# Patient Record
Sex: Female | Born: 1945 | Race: White | Hispanic: No | Marital: Married | Smoking: Never smoker
Health system: Southern US, Community
[De-identification: ages and names within clinical notes are randomized; demographics above are authoritative.]

## PROBLEM LIST (undated history)

## (undated) DIAGNOSIS — I059 Rheumatic mitral valve disease, unspecified: Secondary | ICD-10-CM

## (undated) DIAGNOSIS — I491 Atrial premature depolarization: Secondary | ICD-10-CM

## (undated) DIAGNOSIS — J301 Allergic rhinitis due to pollen: Secondary | ICD-10-CM

## (undated) DIAGNOSIS — R911 Solitary pulmonary nodule: Secondary | ICD-10-CM

## (undated) DIAGNOSIS — J45909 Unspecified asthma, uncomplicated: Secondary | ICD-10-CM

## (undated) DIAGNOSIS — I10 Essential (primary) hypertension: Secondary | ICD-10-CM

## (undated) DIAGNOSIS — I341 Nonrheumatic mitral (valve) prolapse: Secondary | ICD-10-CM

## (undated) DIAGNOSIS — K5900 Constipation, unspecified: Secondary | ICD-10-CM

## (undated) DIAGNOSIS — K219 Gastro-esophageal reflux disease without esophagitis: Secondary | ICD-10-CM

## (undated) DIAGNOSIS — R002 Palpitations: Secondary | ICD-10-CM

## (undated) HISTORY — PX: LAPAROSCOPIC HYSTERECTOMY: SHX1926

## (undated) HISTORY — DX: Atrial premature depolarization: I49.1

## (undated) HISTORY — DX: Palpitations: R00.2

## (undated) HISTORY — DX: Rheumatic mitral valve disease, unspecified: I05.9

## (undated) HISTORY — DX: Gastro-esophageal reflux disease without esophagitis: K21.9

## (undated) HISTORY — DX: Solitary pulmonary nodule: R91.1

## (undated) HISTORY — DX: Nonrheumatic mitral (valve) prolapse: I34.1

## (undated) HISTORY — PX: BACK SURGERY: SHX140

## (undated) HISTORY — DX: Allergic rhinitis due to pollen: J30.1

## (undated) HISTORY — DX: Constipation, unspecified: K59.00

## (undated) HISTORY — DX: Essential (primary) hypertension: I10

---

## 1999-04-14 ENCOUNTER — Other Ambulatory Visit: Admission: RE | Admit: 1999-04-14 | Discharge: 1999-04-14 | Payer: Self-pay | Admitting: Gynecology

## 2002-04-23 ENCOUNTER — Encounter (INDEPENDENT_AMBULATORY_CARE_PROVIDER_SITE_OTHER): Payer: Self-pay | Admitting: Specialist

## 2002-04-23 ENCOUNTER — Encounter: Payer: Self-pay | Admitting: Specialist

## 2002-04-23 ENCOUNTER — Observation Stay (HOSPITAL_COMMUNITY): Admission: RE | Admit: 2002-04-23 | Discharge: 2002-04-24 | Payer: Self-pay | Admitting: Specialist

## 2006-12-28 ENCOUNTER — Ambulatory Visit: Payer: Self-pay | Admitting: Cardiovascular Disease

## 2007-01-21 ENCOUNTER — Ambulatory Visit: Payer: Self-pay

## 2008-08-10 ENCOUNTER — Telehealth (INDEPENDENT_AMBULATORY_CARE_PROVIDER_SITE_OTHER): Payer: Self-pay | Admitting: *Deleted

## 2008-09-21 DIAGNOSIS — I10 Essential (primary) hypertension: Secondary | ICD-10-CM | POA: Insufficient documentation

## 2008-09-21 DIAGNOSIS — I491 Atrial premature depolarization: Secondary | ICD-10-CM | POA: Insufficient documentation

## 2008-09-21 DIAGNOSIS — R002 Palpitations: Secondary | ICD-10-CM | POA: Insufficient documentation

## 2008-09-21 DIAGNOSIS — I059 Rheumatic mitral valve disease, unspecified: Secondary | ICD-10-CM | POA: Insufficient documentation

## 2008-09-21 HISTORY — DX: Essential (primary) hypertension: I10

## 2008-09-22 ENCOUNTER — Ambulatory Visit: Payer: Self-pay | Admitting: Cardiovascular Disease

## 2008-10-01 ENCOUNTER — Ambulatory Visit: Payer: Self-pay

## 2008-10-01 ENCOUNTER — Encounter: Payer: Self-pay | Admitting: Cardiovascular Disease

## 2008-10-02 ENCOUNTER — Telehealth: Payer: Self-pay | Admitting: Cardiovascular Disease

## 2008-10-26 ENCOUNTER — Ambulatory Visit: Payer: Self-pay | Admitting: Internal Medicine

## 2008-10-26 DIAGNOSIS — K5900 Constipation, unspecified: Secondary | ICD-10-CM | POA: Insufficient documentation

## 2008-10-26 DIAGNOSIS — K219 Gastro-esophageal reflux disease without esophagitis: Secondary | ICD-10-CM | POA: Insufficient documentation

## 2008-11-19 ENCOUNTER — Ambulatory Visit: Payer: Self-pay | Admitting: Internal Medicine

## 2009-07-12 ENCOUNTER — Encounter (INDEPENDENT_AMBULATORY_CARE_PROVIDER_SITE_OTHER): Payer: Self-pay | Admitting: *Deleted

## 2010-02-22 NOTE — Letter (Signed)
Summary: Appointment - Reminder 2  [employer], Main Office  [address]   [place], [place] [address]   Phone: [phone]  Fax: [phone]     July 12, 2009 MRN: [id number]   [facility] [address] [place], [place]  [address]   Dear Ms. Ivancic,  Our records indicate that it is time to schedule a follow-up appointment with Dr. Eden Emms in August. It is very important that we reach you to schedule this appointment. We look forward to participating in your health care needs. Please contact us at the number listed above at your earliest convenience to schedule your appointment.  If you are unable to make an appointment at this time, give [place] a call so we can update our records.     Sincerely,   Migdalia Dk [facility] Scheduling Team

## 2010-03-21 ENCOUNTER — Ambulatory Visit (INDEPENDENT_AMBULATORY_CARE_PROVIDER_SITE_OTHER): Payer: Medicare Other | Admitting: Cardiovascular Disease

## 2010-03-21 ENCOUNTER — Encounter: Payer: Self-pay | Admitting: Cardiovascular Disease

## 2010-03-21 DIAGNOSIS — I10 Essential (primary) hypertension: Secondary | ICD-10-CM

## 2010-03-21 DIAGNOSIS — I059 Rheumatic mitral valve disease, unspecified: Secondary | ICD-10-CM

## 2010-03-21 DIAGNOSIS — I4949 Other premature depolarization: Secondary | ICD-10-CM

## 2010-03-31 NOTE — Assessment & Plan Note (Signed)
Summary: 14yr f/u sl appt confirm=mj   Visit Type:  Follow-up Referring Provider:  Charlton Haws, MD Primary Provider:  Brent Bulla, MD   CC:  pt has a cold no cardiac complaints.  History of Present Illness: [name] [name] is seen today for PAC's, and mitral disease.  Echo in 2008 did not show prolapse but she had mild to moderate MR.  She has not had afib and gets rare palpations.  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  She needs a F/u echo.  She has gained some weight but does not have exertional dyspnea, SSCP, edema, PND or otrhopnea.  Last echo 10/01/08 with no prolapse and only mild MR  Has white coat hypertension.  BP readings at home are fine and BP came down quite a bit after talking to her during our visit.  Current Problems (verified): 1)  Special Screening For Malignant Neoplasms Colon  (ICD-V76.51) 2)  Gerd  (ICD-530.81) 3)  Constipation  (ICD-564.00) 4)  Premature Atrial Contractions  (ICD-427.61) 5)  Hypertension  (ICD-401.9) 6)  Palpitations  (ICD-785.1) 7)  Mitral Valve Prolapse  (ICD-424.0)  Current Medications (verified): 1)  Calcium-Magnesium-Vitamin D 200-100-33.3 Mg-Mg-Unit Caps (Calcium-Magnesium-Vitamin D) .Marland Kitchen.. 1 Tab By Mouth Two Times A Day 2)  Multivitamins   Tabs (Multiple Vitamin) .... 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   She has two children.   The patient walks on  a regular basis but is otherwise fairly inactive.  Daily Caffeine Use: one daily  Illicit Drug Use - no Alcohol Use - no  Review of Systems       Denies fever, malais, weight loss, blurry vision, decreased visual acuity, cough, sputum, SOB, hemoptysis, pleuritic pain, palpitaitons, heartburn, abdominal pain, melena, lower extremity edema, claudication, or rash.   Vital Signs:  Patient profile:   65 year old female Height:      67 inches Weight:      142 pounds BMI:     22.32 Pulse rate:   85 / minute BP sitting:   130 / 80  (left arm) Cuff size:   180regular  Vitals Entered By: Marrion Coy, CNA (March 21, 2010 11:25 AM)  Physical Exam  General:  Affect appropriate Healthy:  appears stated age HEENT: normal Neck supple with no adenopathy JVP normal no bruits no thyromegaly Lungs clear with no wheezing and good diaphragmatic motion Heart:  S1/S2 no murmur,rub, gallop or click PMI normal Abdomen: benighn, BS positve, no tenderness, no AAA no bruit.  No HSM or HJR Distal pulses intact with no bruits No edema Neuro non-focal Skin warm and dry    Impression & Recommendations:  Problem # 1:  PREMATURE ATRIAL CONTRACTIONS (ICD-427.61) Benign no palpitatons  Problem # 2:  HYPERTENSION (ICD-401.9) White coat.  No need for medication at this point.  Continue home readings.  Low sodium diet  Problem # 3:  MITRAL VALVE PROLAPSE (ICD-424.0) Mild MR  No murmur on exam today.  f/U echo in a year  Patient Instructions: 1)  Your physician recommends that you schedule a follow-up appointment in: 12 months 2)  Your physician recommends that you continue on your current medications as directed. Please refer to the Current Medication list given to you today.   EKG Report  Procedure date:  03/21/2010  Findings:      NSR 85 PAC's

## 2010-06-07 NOTE — Assessment & Plan Note (Signed)
[facility]                            CARDIOLOGY OFFICE NOTE   NAME:[name], [name] [name]                       MRN:          [id number]  DATE:12/28/2006                            DOB:          07/08/1945    [name] [name] is a 65 year old patient referred by Dr. Shary Decamp for checkup of  mitral valve prolapse.   In talking to the patient, she has had long-standing history of MVP. It  is not clear to me that it has every been symptomatic. She does  occasionally get palpitations. They have not changed over the years.  There has been no syncope, no chest pain, no PND or orthopnea.  She has  never had an echocardiogram here at [facility]. She tells me her last  echocardiogram in [place] was three years ago.   We had a lengthy discussion with [name] regarding the diagnosis of MVP.  We showed her a heart model and explained to her that sometimes it is  associated with other things such as palpitations or dysautonomia.   However, the patient has been quite healthy. She has not had any  significant chest pain. She is active. She has had no lower extremity  edema and really no palpitations.   I told her I would like to repeat an echocardiogram up here at [facility]  so we can see if she even has a diagnosis. She clearly does not have  malignant Barlow's syndrome. Her review of systems otherwise is  negative.   Her past medical history is fairly benign. She has had a hysterectomy  and back surgery.   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  She has two children. Her mother was with her today. She was a long-term  patient of Dr. Corinda Gubler. The patient walks on a regular basis but  otherwise fairly inactive.   She denies any allergies.   Her current medications only include Caltrate and calcium.   Family History: non-contributory   Her exam is remarkable for a weight of 133, afebrile, respiratory rate  12, blood pressure 150/80, pulse 82  and occasional PACs with sinus  arrhythmia. Affect appropriate.  HEENT:  Unremarkable.  Carotids are normal without bruit. No lymphadenopathy, thyromegaly, or  JVP elevation.  LUNGS:  Are clear. Good diaphragmatic motion. No wheezing.  S1 and S2 leaning forward with Valsalva in the left lateral decubitus  position. I cannot hear prolapse or a murmur. PMI is normal.  ABDOMEN:  Benign. However, her abdominal aorta is palpable. It is  somewhat enlarged. She is fairly thin. There is no bruit and no  tenderness. No hepatosplenomegaly. No hepatojugular reflux.  Distal pulses are intact. No edema.  NEUROLOGICAL:  Nonfocal.  SKIN:  Warm and dry.  No muscular weakness.   EKG shows sinus rhythm with PACs and sinus arrhythmia. There is  borderline voltage criteria for LVH.   IMPRESSION:  1. History of mitral valve prolapse, not clearly documented. Really no      bad murmur on exam. Check 2-D echocardiogram to exclude diagnosis.      No  need for SB prophylaxis.  2. Borderline hypertension. Possible left ventricular hypertrophy with      voltage criteria on EKG. Encourage the patient to have a low-salt      diet and monitor blood pressure at home.  3. Sinus rhythm with premature atrial contractions, asymptomatic. No      palpitations. May be related to MVP but likely normal variant.   Overall, patient is stable, and she will have her 2-D echocardiogram and  follow up with Dr. Shary Decamp. I suspect that the diagnosis of MVP is  somewhat spurious.     Noralyn Pick. Eden Emms, MD, [facility]  Electronically Signed    PCN/MedQ  DD: 12/28/2006  DT: 12/29/2006  Job #: [id number]   cc:   Feliciana Rossetti, MD

## 2010-06-10 NOTE — Op Note (Signed)
NAMESATINA, [name] [name]                          ACCOUNT NO.:  [id number]   MEDICAL RECORD NO.:  [id number]                   PATIENT TYPE:  AMB   LOCATION:  DAY                                  FACILITY:  [facility]   PHYSICIAN:  Jene Every, M.D.                 DATE OF BIRTH:  1945/10/08   DATE OF PROCEDURE:  04/23/2002  DATE OF DISCHARGE:                                 OPERATIVE REPORT   PREOPERATIVE DIAGNOSES:  1. Herniated nucleated pulposis.'  2. Spinal stenosis, L5-S1 on the right.   POSTOPERATIVE DIAGNOSES:  1. Herniated nucleated pulposis.'  2. Spinal stenosis, L5-S1 on the right.   PROCEDURE PERFORMED:  Lateral recess decompression and microdiskectomy, L5-  S1.   ANESTHESIA:  General.   SURGEON:  Javier Docker, M.D.   ASSISTANT:  Roma Schanz, PA   BRIEF HISTORY AND INDICATIONS:  This is a 65 year old with severe S1  radiculopathy, myotomal weakness, dermatomal dysesthesias, positive neural  impingement sign with MRI indicating focal disk herniation at L5-S1  compressing the S1 nerve root in the lateral recess, facet hypertrophy, and  lateral recess stenosis.  Operative intervention was indicated for  decompression of the S1 nerve root.  The patient had an asymptomatic L3-4  disk.  Risks and benefits discussed including bleeding, infection, damage to  vascular structures, CSF leakage, epidural fibrosis, adjacent segment  disease, etc.   TECHNIQUE:  The patient in supine position.  After the induction of adequate  general anesthesia and 1 g of Kefzol, the patient was placed prone on the  Amo frame.  All bony prominences well-padded.  Lumbar region is prepped  and draped in the usual sterile fashion.  Two 18 gauge spinal needles are  utilized to localize the L5-S1 interspace confirmed with x-ray.  An incision  was made from the spinous process of L5-S1.  Subcutaneous tissue was  dissected, electrocautery utilized to achieve hemostasis.  The dorsolumbar  fascia identified and divided in line with the skin incision.  Paraspinous  muscles elevated from the lamina of L5 and S1.  McCullough retractor is  placed.  Operating microscope is draped and brought into the surgical field.  Ligamentum flavum detached from the cephalad edge of S1.  Neural patty  placed beneath the ligamentum flavum, protecting the neural elements.  The  ligamentum flavum then removed from the interspace, combined with a  hemilaminotomy at the caudad edge of 5.  Performed a laminotomy at S1 with  foraminotomy of S1 in order to close the S1 nerve root compressing the  lateral recess, entrapment, and vascular release, focal disk herniation and  facet hypertrophy.  Vascular release was released, cauterized.  Lateral  recess decompressed with a 10 mm Kerrison.  The nerve root was gently  mobilized medially, and a focal disk herniation was noted, and the  annulotomy was performed.  Copious portion of disk material was removed from  the disk space and the subannular space.  Small extruded fragment noted, and  this was removed as well.  We checked ___________ thecal sac and the foramen  of S1 and L5.  Following diskectomy, they were found to be widely patent  without evidence of residual disk herniation.  Copiously irrigated the disk  space with antibiotic irrigation.  The nerve root had 1 cm of medial  excursion.  There was no evidence of CSF leakage or active bleeding, and we  placed 1 mL of fentanyl in the epidural space.  McCullough retractor was  removed.  Paraspinous muscles inspected with no evidence of active bleeding.  Dorsolumbar fascia reapproximated with #1 Vicryl interrupted figure-of-eight  sutures, subcutaneous tissue reapproximated with 2-0 Vicryl simple sutures.  Skin was reapproximated with 3-0 subcuticular Prolene.  Wound reinforced  with Steri-Strips.  Sterile dressing applied.  She was placed supine on the  hospital bed, extubated without difficulty, and  transported to the recovery  room in satisfactory condition.   The patient tolerated the procedure well with no complications.                                               Jene Every, M.D.    Cordelia Pen  D:  04/23/2002  T:  04/23/2002  Job:  [id number]

## 2011-01-02 ENCOUNTER — Other Ambulatory Visit: Payer: Self-pay | Admitting: Cardiovascular Disease

## 2011-01-02 ENCOUNTER — Other Ambulatory Visit: Payer: Self-pay | Admitting: *Deleted

## 2011-01-02 DIAGNOSIS — J069 Acute upper respiratory infection, unspecified: Secondary | ICD-10-CM

## 2011-01-10 ENCOUNTER — Telehealth: Payer: Self-pay | Admitting: *Deleted

## 2011-01-10 MED ORDER — PNEUMOCOCCAL VAC POLYVALENT 25 MCG/0.5ML IJ INJ: 0.5000 mL | INJECTION | Freq: Once | INTRAMUSCULAR | Status: AC

## 2011-01-10 NOTE — Telephone Encounter (Signed)
SPOKE WITH HUSBAND  PHARMACY HAS NOT RECEIVED ORDER FOR PNEUMO VAC  WILL CALL AND GIVE VERBAL ORDER VIA PHONE .PER DR NISHAN/CY

## 2011-06-05 ENCOUNTER — Encounter: Payer: Self-pay | Admitting: *Deleted

## 2011-06-05 ENCOUNTER — Encounter: Payer: Self-pay | Admitting: Cardiovascular Disease

## 2011-06-06 ENCOUNTER — Ambulatory Visit: Payer: Medicare Other | Admitting: Cardiovascular Disease

## 2011-07-06 ENCOUNTER — Ambulatory Visit: Payer: Medicare Other | Admitting: Cardiovascular Disease

## 2012-01-16 ENCOUNTER — Other Ambulatory Visit: Payer: Self-pay | Admitting: Obstetrics and Gynecology

## 2013-02-04 ENCOUNTER — Other Ambulatory Visit: Payer: Self-pay | Admitting: Obstetrics and Gynecology

## 2014-05-28 ENCOUNTER — Encounter: Payer: Self-pay | Admitting: Internal Medicine

## 2014-09-29 DIAGNOSIS — L299 Pruritus, unspecified: Secondary | ICD-10-CM

## 2014-09-29 DIAGNOSIS — L259 Unspecified contact dermatitis, unspecified cause: Secondary | ICD-10-CM

## 2015-12-20 ENCOUNTER — Telehealth: Payer: Self-pay | Admitting: Cardiovascular Disease

## 2015-12-20 NOTE — Telephone Encounter (Signed)
Called patient back. Encouraged patient to call her PCP about her BP. Made patient an appointment with Dr. Eden EmmsNishan as a new patient, since patient has not been seen in over 5 years. Patient verbalized understanding and will call her PCP about her BP.

## 2015-12-20 NOTE — Telephone Encounter (Signed)
Dr Eden EmmsNishan patient, last seen 03/21/2010.

## 2015-12-20 NOTE — Telephone Encounter (Signed)
New message  Pt has concerns about BP  Asks if nurse will call back

## 2016-01-24 ENCOUNTER — Encounter (HOSPITAL_COMMUNITY): Payer: Self-pay

## 2016-01-24 ENCOUNTER — Emergency Department (HOSPITAL_COMMUNITY)
Admission: EM | Admit: 2016-01-24 | Discharge: 2016-01-25 | Disposition: A | Discharge status: Home / Self Care | Payer: Medicare Other | Attending: Emergency Medicine | Admitting: Emergency Medicine

## 2016-01-24 DIAGNOSIS — I10 Essential (primary) hypertension: Secondary | ICD-10-CM

## 2016-01-24 DIAGNOSIS — Z7982 Long term (current) use of aspirin: Secondary | ICD-10-CM | POA: Diagnosis not present

## 2016-01-24 DIAGNOSIS — J45909 Unspecified asthma, uncomplicated: Secondary | ICD-10-CM | POA: Insufficient documentation

## 2016-01-24 DIAGNOSIS — Z79899 Other long term (current) drug therapy: Secondary | ICD-10-CM | POA: Insufficient documentation

## 2016-01-24 HISTORY — DX: Unspecified asthma, uncomplicated: J45.909

## 2016-01-24 NOTE — ED Triage Notes (Signed)
Pt states she noticed Bp elevating over a month ago; pt states she has no prior history and just begin monitoring bp at home; pt has appoint  Cardiologist on 02/02/16; pt stases bp was 204/90 at home tonight; Pt bp at 190/116 on arrival to Ed; pt seems to be slightly anxious and talks about having to go see parent in nursing home everyday; pt a&o 4 on arrival. No other noted symptoms; Pt denies pain on arrival.

## 2016-01-25 ENCOUNTER — Encounter (HOSPITAL_COMMUNITY): Payer: Self-pay

## 2016-01-25 MED ORDER — SODIUM CHLORIDE 0.9 % IV BOLUS (SEPSIS)
1000.0000 mL | Freq: Once | INTRAVENOUS | Status: AC
  Administered 2016-01-25: 1000 mL via INTRAVENOUS

## 2016-01-25 MED ORDER — AMLODIPINE BESYLATE 5 MG PO TABS
10.0000 mg | ORAL_TABLET | Freq: Once | ORAL | Status: AC
Start: 2016-01-25 — End: 2016-01-25
  Administered 2016-01-25: 10 mg via ORAL
  Filled 2016-01-25: qty 2

## 2016-01-25 MED ORDER — AMLODIPINE BESYLATE 10 MG PO TABS: 10.0000 mg | ORAL_TABLET | Freq: Every day | ORAL | 0 refills | Status: DC

## 2016-01-25 NOTE — ED Provider Notes (Signed)
[facility]-EMERGENCY DEPT Provider Note   CSN: [id number] Arrival date & time: 01/24/16  2323  By signing my name below, I, Rosario AdieWilliam Andrew Hiatt, attest that this documentation has been prepared under the direction and in the presence of Tomasita CrumbleAdeleke Makalia Bare, MD. Electronically Signed: Rosario AdieWilliam Andrew Hiatt, ED Scribe. 01/25/16. 1:04 AM.  History   Chief Complaint Chief Complaint  Patient presents with  . Hypertension   The history is provided by the patient. No language interpreter was used.    HPI Comments: [name] [name] is a 71 y.o. female with a PMHx asthma and GERD, who presents to the Emergency Department complaining of complaining of hypertension which has been increased over the past month. Pt has not previously been diagnosed or followed by a PCP for HTN, and is otherwise not prescribed medications for this daily. However, she has been tracking this issue at home since 12/24/15, and states that her pressure has been elevated since. Pt recorded her blood pressure at home tonight, which was noted at 204/90 which made her anxious and caused her to come into the ED. Pt reports that prior to checking her blood pressure tonight that she had ate several significantly salty foods. She additionally reports that after checking her BP tonight that she has felt moderately weak in her upper and lower extremities, but denies any numbness or other neurological deficits. Pt also states that she has been increasingly stressed recently secondary to her mother's health and grandchild's safety. Pt has f/u w/ Dr Charlton HawsPeter Nishan of Cardiology for this issue scheduled on 02/02/16 (8 days from now), who she has previously been followed for mitral valve prolapse. She notes that she was diagnosed with Bronchitis recently, but this has mostly resolved. She denies chest pain, shortness of breath, numbness, pain otherwise, vomiting, diarrhea, or any other associated symptoms.   Past Medical History:  Diagnosis Date  . Asthma   .  CONSTIPATION   . GERD   . HYPERTENSION   . MITRAL VALVE PROLAPSE   . Palpitations   . PREMATURE ATRIAL CONTRACTIONS    Patient Active Problem List   Diagnosis Date Noted  . Dermatitis, contact 09/29/2014  . Pruritus, resolving 09/29/2014  . GERD 10/26/2008  . CONSTIPATION 10/26/2008  . HYPERTENSION 09/21/2008  . MITRAL VALVE PROLAPSE 09/21/2008  . PREMATURE ATRIAL CONTRACTIONS 09/21/2008  . PALPITATIONS 09/21/2008   Past Surgical History:  Procedure Laterality Date  . BACK SURGERY     OB History    No data available     Home Medications    Prior to Admission medications   Medication Sig Start Date End Date Taking? Authorizing Provider  aspirin 81 MG chewable tablet Chew 81 mg by mouth daily.   Yes Historical Provider, MD  Cholecalciferol (VITAMIN D PO) Take 1 tablet by mouth daily.   Yes Historical Provider, MD  amLODipine (NORVASC) 10 MG tablet Take 1 tablet (10 mg total) by mouth daily. 01/25/16   Tomasita CrumbleAdeleke Kohana Amble, MD   Family History Family History  Problem Relation Age of Onset  . Heart disease     Social History Social History  Substance Use Topics  . Smoking status: Never Smoker  . Smokeless tobacco: Not on file  . Alcohol use No   Allergies   Levaquin [levofloxacin]  Review of Systems Review of Systems 10 Systems reviewed and all are negative for acute change except as noted in the HPI.  Physical Exam Updated Vital Signs BP 185/94   Pulse 91   Temp 98.1 F (  36.7 C) (Oral)   Resp 12   Ht 5\' 7"  (1.702 m)   Wt 162 lb 8 oz (73.7 kg)   LMP  (Approximate)   SpO2 94%   BMI 25.45 kg/m   Physical Exam  Constitutional: She is oriented to person, place, and time. She appears well-developed and well-nourished. No distress.  HENT:  Head: Normocephalic and atraumatic.  Nose: Nose normal.  Mouth/Throat: Oropharynx is clear and moist. No oropharyngeal exudate.  Eyes: Conjunctivae and EOM are normal. Pupils are equal, round, and reactive to light. No scleral  icterus.  Neck: Normal range of motion. Neck supple. No JVD present. No tracheal deviation present. No thyromegaly present.  Cardiovascular: Normal rate, regular rhythm and normal heart sounds.  Exam reveals no gallop and no friction rub.   No murmur heard. Pulmonary/Chest: Effort normal and breath sounds normal. No respiratory distress. She has no wheezes. She exhibits no tenderness.  Abdominal: Soft. Bowel sounds are normal. She exhibits no distension and no mass. There is no tenderness. There is no rebound and no guarding.  Musculoskeletal: Normal range of motion. She exhibits no edema or tenderness.  Lymphadenopathy:    She has no cervical adenopathy.  Neurological: She is alert and oriented to person, place, and time. No cranial nerve deficit. She exhibits normal muscle tone.  Normal strength and sensation to all extremities.  Skin: Skin is warm and dry. No rash noted. No erythema. No pallor.  Nursing note and vitals reviewed.  ED Treatments / Results  DIAGNOSTIC STUDIES: Oxygen Saturation is 100% on RA, normal by my interpretation.   COORDINATION OF CARE: 1:03 AM-Discussed next steps with pt. Pt verbalized understanding and is agreeable with the plan.   Labs (all labs ordered are listed, but only abnormal results are displayed) Labs Reviewed - No data to display  EKG  EKG Interpretation None      Radiology No results found.  Procedures Procedures   Medications Ordered in ED Medications  sodium chloride 0.9 % bolus 1,000 mL (1,000 mLs Intravenous New Bag/Given 01/25/16 0130)  amLODipine (NORVASC) tablet 10 mg (10 mg Oral Given 01/25/16 0129)    Initial Impression / Assessment and Plan / ED Course  I have reviewed the triage vital signs and the nursing notes.  Pertinent labs & imaging results that were available during my care of the patient were reviewed by me and considered in my medical decision making (see chart for details).  Clinical Course    Patient  presents to the ED for hypertension but is otherwise asymptomatic.  This is not hypertensive emergently.  Education was provided.  Her baseline seems to be around 140s so will start amlodipine and have her follow up with Dr. Eden Emms on 1/10. She demonstrates good understanding of the plan.  Patietn feels bnetter and BP is down to 170s systolic.  She is in NAD.  Still asymptomatic.  Patient saf for DC with close fu.  Final Clinical Impressions(s) / ED Diagnoses   Final diagnoses:  Hypertension, unspecified type   New Prescriptions New Prescriptions   AMLODIPINE (NORVASC) 10 MG TABLET    Take 1 tablet (10 mg total) by mouth daily.     I personally performed the services described in this documentation, which was scribed in my presence. The recorded information has been reviewed and is accurate.      Tomasita Crumble, MD 01/25/16 [phone]

## 2016-01-28 NOTE — Progress Notes (Signed)
Cardiology Office Note   Date:  02/02/2016   ID:  [name] [name], DOB 12/15/1945, MRN [id number]  PCP:  Abigail MiyamotoPERRY,LAWRENCE EDWARD, MD  Cardiologist:   Charlton HawsPeter Bennet Kujawa, MD   Chief Complaint  Patient presents with  . Establish Care      History of Present Illness: [name] [name] is a 71 y.o. female who presents for evaluation of HTN. Seen in ER 01/24/16 Unfortunately she has Not been followed by a primary doctor who could address this. Last seen by cardiology in 2012 for history of MVP and PaC;s Previous echo with no prolapse but mild to moderate MR. In 2012 noted component of white coat hypertenson Prior to ER visit had lots of salty foods She noted home systolic reading over 200 mmHg. She has had some stress  Over family issues started on norvasc by Tomasita Crumbleni adeleke MD on 01/25/16   Thinks norvasc is making her jittery Anxious about grand daughters anxiety and depression [facility] won't [facility] Up  To go to school    Past Medical History:  Diagnosis Date  . Asthma   . CONSTIPATION   . GERD   . HYPERTENSION   . MITRAL VALVE PROLAPSE   . Palpitations   . PREMATURE ATRIAL CONTRACTIONS     Past Surgical History:  Procedure Laterality Date  . BACK SURGERY       Current Outpatient Prescriptions  Medication Sig Dispense Refill  . aspirin 81 MG chewable tablet Chew 81 mg by mouth daily.    . Cholecalciferol (VITAMIN D PO) Take 1 tablet by mouth daily.    . metoprolol tartrate (LOPRESSOR) 25 MG tablet Take 1 tablet (25 mg total) by mouth 2 (two) times daily. 180 tablet 3   No current facility-administered medications for this visit.     Allergies:   Levaquin [levofloxacin]    Social History:  The patient  reports that she has never smoked. She has never used smokeless tobacco. She reports that she does not drink alcohol.   Family History:  The patient's family history is not on file.    ROS:  Please see the history of present illness.   Otherwise, review of  systems are positive for none.   All other systems are reviewed and negative.    PHYSICAL EXAM: VS:  BP (!) 150/86 (BP Location: Right Arm, Patient Position: Sitting, Cuff Size: Normal)   Pulse 73   Ht 5\' 7"  (1.702 m)   Wt 152 lb (68.9 kg)   LMP  (Approximate)   SpO2 97%   BMI 23.81 kg/m  , BMI Body mass index is 23.81 kg/m. Affect appropriate Healthy:  appears stated age HEENT: normal Neck supple with no adenopathy JVP normal no bruits no thyromegaly Lungs clear with no wheezing and good diaphragmatic motion Heart:  S1/S2 no murmur, no rub, gallop or click PMI normal Abdomen: benighn, BS positve, no tenderness, no AAA no bruit.  No HSM or HJR Distal pulses intact with no bruits No edema Neuro non-focal Skin warm and dry No muscular weakness    EKG:  03/21/10 NSR PAC nonspecific ST/T wave changes 02/02/16 SR rate 73 Pac otherwise normal   Recent Labs: No results found for requested labs within last 8760 hours.    Lipid Panel No results found for: CHOL, TRIG, HDL, CHOLHDL, VLDL, LDLCALC, LDLDIRECT    Wt Readings from Last 3 Encounters:  02/02/16 152 lb (68.9 kg)  01/24/16 162 lb 8 oz (73.7  kg)  03/21/10 142 lb (64.4 kg)      Other studies Reviewed: Additional studies/ records that were reviewed today include: notes ER ECG and labs .    ASSESSMENT AND PLAN:  1.  HTN: reactive to stress d/c norvasc start lopressor 25 bid  2. PAC;s benign observe 3. Mitral Valve Disease once BP better controlled will do f/u echo no significant murmur on exam  She does not see her primary regularly will order labs including chol, Thyroid and CMP   Current medicines are reviewed at length with the patient today.  The patient does not have concerns regarding medicines.  The following changes have been made:  Lopressor 25 bid d/c norvasc   Labs/ tests ordered today include: CMP TSH Cholestersl   Orders Placed This Encounter  Procedures  . Comprehensive metabolic panel  .  CBC with Differential/Platelet  . Hemoglobin A1c  . TSH  . Lipid panel  . EKG 12-Lead     Disposition:   FU with me in 3 months      Signed, Charlton Haws, MD  02/02/2016 10:36 AM    [facility] [address] [place], [place], [place]  [address] Phone: [phone]; Fax: [phone]

## 2016-02-02 ENCOUNTER — Encounter (INDEPENDENT_AMBULATORY_CARE_PROVIDER_SITE_OTHER): Payer: Self-pay

## 2016-02-02 ENCOUNTER — Ambulatory Visit (INDEPENDENT_AMBULATORY_CARE_PROVIDER_SITE_OTHER): Payer: Medicare Other | Admitting: Cardiovascular Disease

## 2016-02-02 ENCOUNTER — Telehealth: Payer: Self-pay | Admitting: Cardiovascular Disease

## 2016-02-02 ENCOUNTER — Encounter: Payer: Self-pay | Admitting: Cardiovascular Disease

## 2016-02-02 VITALS — BP 150/86 | HR 73 | Ht 67.0 in | Wt 152.0 lb

## 2016-02-02 DIAGNOSIS — I10 Essential (primary) hypertension: Secondary | ICD-10-CM

## 2016-02-02 DIAGNOSIS — Z7689 Persons encountering health services in other specified circumstances: Secondary | ICD-10-CM | POA: Diagnosis not present

## 2016-02-02 DIAGNOSIS — R002 Palpitations: Secondary | ICD-10-CM

## 2016-02-02 DIAGNOSIS — I059 Rheumatic mitral valve disease, unspecified: Secondary | ICD-10-CM

## 2016-02-02 DIAGNOSIS — I491 Atrial premature depolarization: Secondary | ICD-10-CM | POA: Diagnosis not present

## 2016-02-02 MED ORDER — METOPROLOL TARTRATE 25 MG PO TABS: 25.0000 mg | ORAL_TABLET | Freq: Two times a day (BID) | ORAL | 3 refills | Status: DC

## 2016-02-02 NOTE — Telephone Encounter (Signed)
°  New Prob  Calling regarding Propranolol 20 mg and Celexa 20 mg inquired during visit. Please call.

## 2016-02-02 NOTE — Patient Instructions (Addendum)
Medication Instructions:  Your physician has recommended you make the following change in your medication:  1-STOP amilodipine 2-START Metoprolol 25 mg by mouth twice daily  Labwork: Your physician recommends that you have lab work CBC, CMET, TSH, Lipid panel  Testing/Procedures: NONE  Follow-Up: Your physician wants you to follow-up in: 3 months with Dr. Eden EmmsNishan.   If you need a refill on your cardiac medications before your next appointment, please call your pharmacy.

## 2016-02-02 NOTE — Telephone Encounter (Signed)
Patient had a question that referred to her granddaughter. Questions were answered.

## 2016-02-03 LAB — CBC WITH DIFFERENTIAL/PLATELET
BASOS ABS: 0.1 10*3/uL (ref 0.0–0.2)
Basos: 1 %
EOS (ABSOLUTE): 0.1 10*3/uL (ref 0.0–0.4)
EOS: 1 %
HEMOGLOBIN: 13.5 g/dL (ref 11.1–15.9)
Hematocrit: 39.4 % (ref 34.0–46.6)
IMMATURE GRANS (ABS): 0 10*3/uL (ref 0.0–0.1)
Immature Granulocytes: 0 %
LYMPHS ABS: 1.6 10*3/uL (ref 0.7–3.1)
LYMPHS: 20 %
MCH: 30.8 pg (ref 26.6–33.0)
MCHC: 34.3 g/dL (ref 31.5–35.7)
MCV: 90 fL (ref 79–97)
MONOCYTES: 7 %
Monocytes Absolute: 0.6 10*3/uL (ref 0.1–0.9)
NEUTROS ABS: 5.6 10*3/uL (ref 1.4–7.0)
Neutrophils: 71 %
Platelets: 281 10*3/uL (ref 150–379)
RBC: 4.39 x10E6/uL (ref 3.77–5.28)
RDW: 13.3 % (ref 12.3–15.4)
WBC: 7.9 10*3/uL (ref 3.4–10.8)

## 2016-02-03 LAB — COMPREHENSIVE METABOLIC PANEL
ALBUMIN: 4.2 g/dL (ref 3.5–4.8)
ALT: 17 IU/L (ref 0–32)
AST: 23 IU/L (ref 0–40)
Albumin/Globulin Ratio: 1.6 (ref 1.2–2.2)
Alkaline Phosphatase: 63 IU/L (ref 39–117)
BILIRUBIN TOTAL: 0.5 mg/dL (ref 0.0–1.2)
BUN / CREAT RATIO: 26 (ref 12–28)
BUN: 27 mg/dL (ref 8–27)
CALCIUM: 9.6 mg/dL (ref 8.7–10.3)
CHLORIDE: 102 mmol/L (ref 96–106)
CO2: 24 mmol/L (ref 18–29)
Creatinine, Ser: 1.02 mg/dL — ABNORMAL HIGH (ref 0.57–1.00)
GFR calc non Af Amer: 55 mL/min/{1.73_m2} — ABNORMAL LOW (ref >59)
GFR, EST AFRICAN AMERICAN: 64 mL/min/{1.73_m2} (ref >59)
GLUCOSE: 93 mg/dL (ref 65–99)
Globulin, Total: 2.7 g/dL (ref 1.5–4.5)
Potassium: 4.1 mmol/L (ref 3.5–5.2)
Sodium: 142 mmol/L (ref 134–144)
TOTAL PROTEIN: 6.9 g/dL (ref 6.0–8.5)

## 2016-02-03 LAB — TSH: TSH: 1.29 u[IU]/mL (ref 0.450–4.500)

## 2016-02-03 LAB — LIPID PANEL
CHOLESTEROL TOTAL: 238 mg/dL — AB (ref 100–199)
Chol/HDL Ratio: 3.7 ratio units (ref 0.0–4.4)
HDL: 64 mg/dL (ref >39)
LDL CALC: 153 mg/dL — AB (ref 0–99)
TRIGLYCERIDES: 105 mg/dL (ref 0–149)
VLDL Cholesterol Cal: 21 mg/dL (ref 5–40)

## 2016-02-03 LAB — HEMOGLOBIN A1C
Est. average glucose Bld gHb Est-mCnc: 111 mg/dL
HEMOGLOBIN A1C: 5.5 % (ref 4.8–5.6)

## 2016-04-28 ENCOUNTER — Encounter: Payer: Self-pay | Admitting: *Deleted

## 2016-05-14 NOTE — Progress Notes (Signed)
Cardiology Office Note   Date:  05/17/2016   ID:  [name] [name], DOB [birth year], MRN [id number]  PCP:  Abigail Miyamoto, MD  Cardiologist:   Charlton Haws, MD   Chief Complaint  Patient presents with  . PACs      History of Present Illness: [name] [name] is a 71 y.o. female who presents for evaluation of HTN. Seen in ER 01/24/16 Unfortunately she has Not been followed by a primary doctor who could address this. History of MVP and PaC;s Previous echo with no prolapse but mild to moderate MR. In 2012 noted component of white coat hypertenson Prior to ER visit had lots of salty foods She noted home systolic reading over 200 mmHg. She has had some stress  Over family issues started on norvasc by Tomasita Crumble MD on 01/25/16   Norvasc made her jittery and last visit we changed her to [employer] daughters are doing better. She goes to [employer] in [employer] everyday To look in on her mom  Wants a once/day beta blocker  Needs GI referral for f/u colonoscopy seen by Lina Sar in past   Past Medical History:  Diagnosis Date  . Asthma   . CONSTIPATION   . GERD   . HYPERTENSION   . MITRAL VALVE PROLAPSE   . Palpitations   . PREMATURE ATRIAL CONTRACTIONS     Past Surgical History:  Procedure Laterality Date  . BACK SURGERY       Current Outpatient Prescriptions  Medication Sig Dispense Refill  . aspirin 81 MG chewable tablet Chew 81 mg by mouth daily.    . Cholecalciferol (VITAMIN D PO) Take 1 tablet by mouth daily.    . metoprolol succinate (TOPROL XL) 25 MG 24 hr tablet Take 1 tablet (25 mg total) by mouth daily. 90 tablet 3   No current facility-administered medications for this visit.     Allergies:   Levaquin [levofloxacin]    Social History:  The patient  reports that she has never smoked. She has never used smokeless tobacco. She reports that she does not drink alcohol.   Family History:  The patient's family history is not on file.    ROS:  Please see  the history of present illness.   Otherwise, review of systems are positive for none.   All other systems are reviewed and negative.    PHYSICAL EXAM: VS:  BP 138/76   Pulse 65   Ht 5' 7.5" (1.715 m)   Wt 152 lb 1.9 oz (69 kg)   SpO2 98%   BMI 23.47 kg/m  , BMI Body mass index is 23.47 kg/m. Affect appropriate Healthy:  appears stated age HEENT: normal Neck supple with no adenopathy JVP normal no bruits no thyromegaly Lungs clear with no wheezing and good diaphragmatic motion Heart:  S1/S2 no murmur, no rub, gallop or click PMI normal Abdomen: benighn, BS positve, no tenderness, no AAA no bruit.  No HSM or HJR Distal pulses intact with no bruits No edema Neuro non-focal Skin warm and dry No muscular weakness    EKG:  03/21/10 NSR PAC nonspecific ST/T wave changes 02/02/16 SR rate 73 Pac otherwise normal   Recent Labs: 02/02/2016: ALT 17; BUN 27; Creatinine, Ser 1.02; Platelets 281; Potassium 4.1; Sodium 142; TSH 1.290    Lipid Panel    Component Value Date/Time   CHOL 238 (H) 02/02/2016 1039   TRIG 105 02/02/2016 1039   HDL 64 02/02/2016 1039   CHOLHDL  3.7 02/02/2016 1039   LDLCALC 153 (H) 02/02/2016 1039      Wt Readings from Last 3 Encounters:  05/17/16 152 lb 1.9 oz (69 kg)  02/02/16 152 lb (68.9 kg)  01/24/16 162 lb 8 oz (73.7 kg)      Other studies Reviewed: Additional studies/ records that were reviewed today include: notes ER ECG and labs .    ASSESSMENT AND PLAN:  1.  HTN: reactive to stress last visit norvasc stopped and started on beta blocker change to Toprol 2. PAC;s benign observe will change to Toprol as she forgets to take her afternoon pill 3. Mitral Valve Disease f/u echo no significant murmur on exam  4. GI:  Refer back to [facility] for f/u colonoscopy  She does not see her primary regularly will order labs including chol, Thyroid and CMP   Current medicines are reviewed at length with the patient today.  The patient does not have  concerns regarding medicines.  The following changes have been made:     Labs/ tests ordered today include:    No orders of the defined types were placed in this encounter.    Disposition:   FU with me in a year     Signed, Charlton Haws, MD  05/17/2016 8:46 AM    [facility] [address] [place], [place], [place]  [address] Phone: [phone]; Fax: [phone]

## 2016-05-17 ENCOUNTER — Encounter: Payer: Self-pay | Admitting: Cardiovascular Disease

## 2016-05-17 ENCOUNTER — Ambulatory Visit (INDEPENDENT_AMBULATORY_CARE_PROVIDER_SITE_OTHER): Payer: Medicare Other | Admitting: Cardiovascular Disease

## 2016-05-17 ENCOUNTER — Encounter (INDEPENDENT_AMBULATORY_CARE_PROVIDER_SITE_OTHER): Payer: Self-pay

## 2016-05-17 VITALS — BP 138/76 | HR 65 | Ht 67.5 in | Wt 152.1 lb

## 2016-05-17 DIAGNOSIS — I491 Atrial premature depolarization: Secondary | ICD-10-CM

## 2016-05-17 DIAGNOSIS — I10 Essential (primary) hypertension: Secondary | ICD-10-CM | POA: Diagnosis not present

## 2016-05-17 MED ORDER — METOPROLOL SUCCINATE ER 25 MG PO TB24: 25.0000 mg | ORAL_TABLET | Freq: Every day | ORAL | 3 refills | Status: DC

## 2016-05-17 NOTE — Patient Instructions (Addendum)
Medication Instructions:  Your physician has recommended you make the following change in your medication:  1-START Toprol 25 mg by mouth daily  Labwork: NONE  Testing/Procedures: NONE  Follow-Up: Your physician wants you to follow-up in: 12 months with Dr. Eden Emms. You will receive a reminder letter in the mail two months in advance. If you don't receive a letter, please call our office to schedule the follow-up appointment.   If you need a refill on your cardiac medications before your next appointment, please call your pharmacy.

## 2016-10-04 ENCOUNTER — Ambulatory Visit (INDEPENDENT_AMBULATORY_CARE_PROVIDER_SITE_OTHER): Payer: Medicare Other

## 2016-10-04 ENCOUNTER — Ambulatory Visit (HOSPITAL_COMMUNITY)
Admission: EM | Admit: 2016-10-04 | Discharge: 2016-10-04 | Disposition: A | Discharge status: Home / Self Care | Payer: Medicare Other | Attending: Urgent Care | Admitting: Urgent Care

## 2016-10-04 ENCOUNTER — Encounter (HOSPITAL_COMMUNITY): Payer: Self-pay | Admitting: Emergency Medicine

## 2016-10-04 DIAGNOSIS — M25471 Effusion, right ankle: Secondary | ICD-10-CM | POA: Diagnosis not present

## 2016-10-04 DIAGNOSIS — M25571 Pain in right ankle and joints of right foot: Secondary | ICD-10-CM

## 2016-10-04 DIAGNOSIS — R030 Elevated blood-pressure reading, without diagnosis of hypertension: Secondary | ICD-10-CM | POA: Diagnosis not present

## 2016-10-04 DIAGNOSIS — I10 Essential (primary) hypertension: Secondary | ICD-10-CM

## 2016-10-04 DIAGNOSIS — S8264XA Nondisplaced fracture of lateral malleolus of right fibula, initial encounter for closed fracture: Secondary | ICD-10-CM

## 2016-10-04 MED ORDER — TRAMADOL HCL 50 MG PO TABS: 50.0000 mg | ORAL_TABLET | Freq: Four times a day (QID) | ORAL | 0 refills | Status: DC | PRN

## 2016-10-04 NOTE — ED Triage Notes (Signed)
The patient presented to the [facility] with a complaint of right ankle pain secondary to a fall earlier today.

## 2016-10-04 NOTE — Discharge Instructions (Signed)
Please contact [facility] for further management of your ankle/fibular fracture. Use Tylenol and ibuprofen for pain and inflammation and Tramadol for breakthrough pain.

## 2016-10-04 NOTE — ED Provider Notes (Signed)
MRN: [id number] DOB: 10/11/1945  Subjective:   [name] [name] is a 71 y.o. female presenting for chief complaint of Ankle Pain  Reports slipping on pavement earlier today while it was raining. Her right leg bent behind her and she hurt her right ankle. She has since had swelling, pain, difficulty bearing weight. She has applied an ice pack for more than an hour. Denies weakness, bony deformity.   No current facility-administered medications for this encounter.    Current Outpatient Prescriptions  Medication Sig Dispense Refill  . amoxicillin (AMOXIL) 875 MG tablet Take 875 mg by mouth 2 (two) times daily.    Marland Kitchen. aspirin 81 MG chewable tablet Chew 81 mg by mouth daily.    . metoprolol succinate (TOPROL XL) 25 MG 24 hr tablet Take 1 tablet (25 mg total) by mouth daily. 90 tablet 3  . traMADol (ULTRAM) 50 MG tablet Take 1 tablet (50 mg total) by mouth every 6 (six) hours as needed. 15 tablet 0    Dois DavenportSandra is allergic to levaquin [levofloxacin].  Dois DavenportSandra  has a past medical history of Asthma; CONSTIPATION; GERD; HYPERTENSION; MITRAL VALVE PROLAPSE; Palpitations; and PREMATURE ATRIAL CONTRACTIONS. Also  has a past surgical history that includes Back surgery.  Objective:   Vitals: BP (!) 176/82 (BP Location: Right Arm)   Pulse 83   Temp 98.9 F (37.2 C) (Oral)   Resp 18   SpO2 99%   Physical Exam  Constitutional: She is oriented to person, place, and time. She appears well-developed and well-nourished.  Cardiovascular: Normal rate.   Pulmonary/Chest: Effort normal.  Musculoskeletal:       Right ankle: She exhibits decreased range of motion (eversion of foot) and swelling (lateral malleolus). She exhibits no ecchymosis, no deformity and no laceration. Tenderness. Lateral malleolus, medial malleolus and AITFL tenderness found. No CF ligament, no posterior TFL, no head of 5th metatarsal and no proximal fibula tenderness found. Achilles tendon exhibits no pain and no defect.  Neurological: She is  alert and oriented to person, place, and time.   Dg Ankle Complete Right  Result Date: 10/04/2016 CLINICAL DATA:  Right ankle injury, pain, swelling EXAM: RIGHT ANKLE - COMPLETE 3+ VIEW COMPARISON:  None. FINDINGS: Lateral soft tissue swelling. There is an oblique fracture through the distal fibular metaphysis. Well corticated bone fragments adjacent to the medial and lateral malleoli, likely related to old injury. Ankle mortise appears intact. IMPRESSION: Nondisplaced oblique fracture through the distal right fibular metaphysis. Electronically Signed   By: Charlett NoseKevin  Dover M.D.   On: 10/04/2016 19:12    Assessment and Plan :   Closed nondisplaced fracture of lateral malleolus of right fibula, initial encounter  Acute right ankle pain  Right ankle swelling  Elevated blood pressure reading  Essential hypertension  Stabilized with short leg splint. Ambulate with crutches, non-weight bearing. NSAID with APAP and Ultram for breakthrough pain. [facility]  She will check in with her PCP regarding her high BP. Admits that she has white coat syndrome.  Wallis BambergMario Tishina Lown, PA-C [facility]  10/04/2016  6:15 PM   Wallis BambergMani, Jasey Cortez, PA-C 10/04/16 1955

## 2017-01-22 ENCOUNTER — Telehealth: Payer: Self-pay | Admitting: Cardiovascular Disease

## 2017-01-22 NOTE — Telephone Encounter (Signed)
   Pt called again this evening.  BP has been running up all day in the setting of prednisone therapy that was started 4 days ago for sinus congestion and possible asthma flare.  She usually takes metoprolol 25 BID but has taken an extra tab today as BPs have been 190's to 200.  She is asymptomatic.  HR's have been in the high 60's to low 70's.  She says she started tapering prednisone yesterday and does not plan to take anymore due to high bps.  I rec that she may take an additional 25mg  of metoprolol now.  She is due for another 25 later this evening.  If bp remains > 180 systolic despite additional metoprolol or she becomes symptomatic, she is to seek attention in the ED.  If pressure stabilizes but is high again tomorrow morning, it is likely that she may have to increase to 50 bid, but I rect that she call for advice prior to doing that.  Caller verbalized understanding and was grateful for the call back.  Nicolasa Duckinghristopher Leeanne Butters, NP 01/22/2017, 6:13 PM

## 2017-01-22 NOTE — Telephone Encounter (Signed)
Spoke with patient about her BP. Patient stated last week her PCP started her on prednisone to taper off and now her BP is up. Patient stated tomorrow is her last dose. Informed patient that prednisone is most likely causing her BP to be elevated. Instructed patient to call her PCP with BP issue since they started her on prednisone to see if they want her to continue medication. Patient also wanted to know if it was time for her office visit. Patient is suppose to follow up with an office visit in the Spring. Made an appointment for patient to see Dr. Eden EmmsNishan in March. Patient stated she would call her PCP and see Dr. Eden EmmsNishan in March. Will forward to Dr. Eden EmmsNishan for further advisement.

## 2017-01-22 NOTE — Telephone Encounter (Signed)
New Message   Pt c/o BP issue:  1. What are your last 5 BP readings? 192/88 2. Are you having any other symptoms (ex. Dizziness, headache, blurred vision, passed out)? No symptons 3. What is your medication issue? None  Patient states that her bottom number is typically low. But her top has been running higher than 140.

## 2017-01-23 ENCOUNTER — Emergency Department (HOSPITAL_COMMUNITY): Payer: Medicare Other

## 2017-01-23 ENCOUNTER — Encounter (HOSPITAL_COMMUNITY): Payer: Self-pay | Admitting: *Deleted

## 2017-01-23 ENCOUNTER — Other Ambulatory Visit: Payer: Self-pay

## 2017-01-23 DIAGNOSIS — J45909 Unspecified asthma, uncomplicated: Secondary | ICD-10-CM | POA: Diagnosis not present

## 2017-01-23 DIAGNOSIS — I491 Atrial premature depolarization: Secondary | ICD-10-CM | POA: Insufficient documentation

## 2017-01-23 DIAGNOSIS — I10 Essential (primary) hypertension: Secondary | ICD-10-CM | POA: Insufficient documentation

## 2017-01-23 LAB — BASIC METABOLIC PANEL
ANION GAP: 7 (ref 5–15)
BUN: 22 mg/dL — ABNORMAL HIGH (ref 6–20)
CALCIUM: 9.4 mg/dL (ref 8.9–10.3)
CO2: 29 mmol/L (ref 22–32)
Chloride: 105 mmol/L (ref 101–111)
Creatinine, Ser: 0.92 mg/dL (ref 0.44–1.00)
Glucose, Bld: 97 mg/dL (ref 65–99)
Potassium: 3.5 mmol/L (ref 3.5–5.1)
SODIUM: 141 mmol/L (ref 135–145)

## 2017-01-23 LAB — CBC
HEMATOCRIT: 39.6 % (ref 36.0–46.0)
HEMOGLOBIN: 13 g/dL (ref 12.0–15.0)
MCH: 30.3 pg (ref 26.0–34.0)
MCHC: 32.8 g/dL (ref 30.0–36.0)
MCV: 92.3 fL (ref 78.0–100.0)
Platelets: 264 10*3/uL (ref 150–400)
RBC: 4.29 MIL/uL (ref 3.87–5.11)
RDW: 12.4 % (ref 11.5–15.5)
WBC: 10.6 10*3/uL — AB (ref 4.0–10.5)

## 2017-01-23 LAB — TROPONIN I

## 2017-01-23 NOTE — ED Triage Notes (Signed)
The pt is c/o her bp being high for 2 days no pain or discomfort  Pulse sl irregular

## 2017-01-24 ENCOUNTER — Telehealth: Payer: Self-pay | Admitting: Cardiovascular Disease

## 2017-01-24 ENCOUNTER — Emergency Department (HOSPITAL_COMMUNITY)
Admission: EM | Admit: 2017-01-24 | Discharge: 2017-01-24 | Disposition: A | Discharge status: Home / Self Care | Payer: Medicare Other | Attending: Emergency Medicine | Admitting: Emergency Medicine

## 2017-01-24 DIAGNOSIS — I10 Essential (primary) hypertension: Secondary | ICD-10-CM

## 2017-01-24 MED ORDER — METOPROLOL TARTRATE 50 MG PO TABS: 50.0000 mg | ORAL_TABLET | Freq: Two times a day (BID) | ORAL | 0 refills | Status: DC

## 2017-01-24 NOTE — Discharge Instructions (Addendum)
Take Metoprolol 50 mg twice a day.   Follow-up with your primary care doctor the next 24-48 hours. Call them and tell them you were seen in the ED  As we discussed, follow-up with your primary care doctor regarding the findings on your chest xray. This will need further evaluation most likely by a Ct.   Return to the Emergency Dept for any difficulty breathing, headache, blurry vision, numbness/weakness of your arms or legs or any other worsening or this.

## 2017-01-24 NOTE — Telephone Encounter (Signed)
Patient called about her metoprolol. Patient stated she feels sick on her stomach after she takes the metoprolol, but it does help with her BP. Informed patient from the notes on her ED visit, that she is suppose to take Metoprolol 50 mg by mouth BID and follow-up with her PCP. Asked patient if she has eaten shortly before she has the upset stomach. Patient stated yes, that lately she has been having an upset stomach after she eats. Informed patient that it might not be the medication. Patient stated she has stopped her prednisone, so hopefully her BP will come back down.  Patient was wondering if she could come in for an appointment, made patient next available appt with Nada BoozerLaura Ingold NP. Patient sounds very anxious and making the appt seemed to help her anxiety. Patient also asked if she could change to something else besides Metoprolol. Will forward to Dr. Eden EmmsNishan for advisement.

## 2017-01-24 NOTE — Telephone Encounter (Signed)
Pt c/o medication issue:  1. Name of Medication: metoprolol tartrate (LOPRESSOR) 50 MG tablet  2. How are you currently taking this medication (dosage and times per day)? Take 1 tablet (50 mg total) by mouth 2 (two) times daily  3. Are you having a reaction (difficulty breathing--STAT)? Tightness in chest  4. What is your medication issue? Patient still feeling bad wants to know if she should take more

## 2017-01-24 NOTE — ED Provider Notes (Signed)
[facility] Provider Note   CSN: [id number] Arrival date & time: 01/23/17  2238     History   Chief Complaint Chief Complaint  Patient presents with  . Hypertension    HPI [name] [name] is a 72 y.o. female with PMH/o HTN who presents for evaluation of elevated blood pressure.  Patient reports that for the last 2 days, her blood pressure has been elevated.  She reports that she is kept a log at home and has had multiple readings of high blood pressures.  She states that it is ranged anywhere from 188/92 - 215/105.  Patient reports that she called her primary care doctor yesterday for evaluation of blood pressure and was told to take an extra metoprolol last night which she did.  Patient reports that she is continue to monitor her blood pressure and noticed that it continued to elevate, prompting ED visit.  Patient reports that she gets very anxious and nervous about her blood pressure will repeatedly check it.  Patient does report that when she was seen by her primary care doctor approximately 1.5 weeks ago, they had noted that her blood pressure was higher than normal but they were going to monitor it.  Patient reports that she tries to be compliant with her metoprolol.  She states that when she does not think about her blood pressure, she will sometimes forget to take the metoprolol.  Her last dose was 5 PM this afternoon.  Patient denies any chest pain, vision changes, headache, difficulty breathing, numbness/weakness of arms or legs, abdominal pain,back pain, dysuria, hematuria.  The history is provided by the patient.    Past Medical History:  Diagnosis Date  . Asthma   . CONSTIPATION   . GERD   . HYPERTENSION   . MITRAL VALVE PROLAPSE   . Palpitations   . PREMATURE ATRIAL CONTRACTIONS     Patient Active Problem List   Diagnosis Date Noted  . Dermatitis, contact 09/29/2014  . Pruritus, resolving 09/29/2014  . GERD 10/26/2008  .  CONSTIPATION 10/26/2008  . Essential hypertension 09/21/2008  . MITRAL VALVE PROLAPSE 09/21/2008  . PREMATURE ATRIAL CONTRACTIONS 09/21/2008  . PALPITATIONS 09/21/2008    Past Surgical History:  Procedure Laterality Date  . BACK SURGERY      OB History    No data available       Home Medications    Prior to Admission medications   Medication Sig Start Date End Date Taking? Authorizing Provider  metoprolol succinate (TOPROL XL) 25 MG 24 hr tablet Take 1 tablet (25 mg total) by mouth daily. Patient not taking: Reported on 01/24/2017 05/17/16   Wendall StadeNishan, Peter C, MD  metoprolol tartrate (LOPRESSOR) 50 MG tablet Take 1 tablet (50 mg total) by mouth 2 (two) times daily. 01/24/17 02/23/17  Maxwell CaulLayden, Lindsey A, PA-C  traMADol (ULTRAM) 50 MG tablet Take 1 tablet (50 mg total) by mouth every 6 (six) hours as needed. Patient not taking: Reported on 01/24/2017 10/04/16   Wallis BambergMani, Mario, PA-C    Family History Family History  Problem Relation Age of Onset  . Heart disease Unknown     Social History Social History   Tobacco Use  . Smoking status: Never Smoker  . Smokeless tobacco: Never Used  Substance Use Topics  . Alcohol use: No  . Drug use: Not on file     Allergies   Levaquin [levofloxacin]   Review of Systems Review of Systems  Constitutional: Negative for fever.  Respiratory: Negative for cough and shortness of breath.   Cardiovascular: Negative for chest pain.  Gastrointestinal: Negative for abdominal pain, nausea and vomiting.  Genitourinary: Negative for dysuria and hematuria.  Neurological: Negative for headaches.     Physical Exam Updated Vital Signs BP (!) 185/84   Pulse 62   Temp 98.2 F (36.8 C) (Oral)   Resp 16   Ht 5\' 7"  (1.702 m)   Wt 72.6 kg (160 lb)   SpO2 98%   BMI 25.06 kg/m   Physical Exam  Constitutional: She is oriented to person, place, and time. She appears well-developed and well-nourished.  Anxious  HENT:  Head: Normocephalic and  atraumatic.  Mouth/Throat: Oropharynx is clear and moist and mucous membranes are normal.  Eyes: Conjunctivae, EOM and lids are normal. Pupils are equal, round, and reactive to light.  Neck: Full passive range of motion without pain.  Cardiovascular: Normal rate, regular rhythm, normal heart sounds and normal pulses. Exam reveals no gallop and no friction rub.  No murmur heard. Pulmonary/Chest: Effort normal and breath sounds normal.  Abdominal: Soft. Normal appearance. There is no tenderness. There is no rigidity and no guarding.  Musculoskeletal: Normal range of motion.  Neurological: She is alert and oriented to person, place, and time.  Cranial nerves III-XII intact Follows commands, Moves all extremities  5/5 strength to BUE and BLE  Sensation intact throughout all major nerve distributions Normal finger to nose. No dysdiadochokinesia. No pronator drift. No gait abnormalities  No slurred speech. No facial droop.   Skin: Skin is warm and dry. Capillary refill takes less than 2 seconds.  Psychiatric: She has a normal mood and affect. Her speech is normal.  Nursing note and vitals reviewed.    ED Treatments / Results  Labs (all labs ordered are listed, but only abnormal results are displayed) Labs Reviewed  BASIC METABOLIC PANEL - Abnormal; Notable for the following components:      Result Value   BUN 22 (*)    All other components within normal limits  CBC - Abnormal; Notable for the following components:   WBC 10.6 (*)    All other components within normal limits  TROPONIN I    EKG  EKG Interpretation  Date/Time:  Tuesday January 23 2017 22:48:34 EST Ventricular Rate:  65 PR Interval:  154 QRS Duration: 84 QT Interval:  402 QTC Calculation: 418 R Axis:   58 Text Interpretation:  Sinus rhythm with marked sinus arrhythmia Nonspecific ST abnormality Abnormal ECG No significant change was found Confirmed by Azalia Bilis ([id number]) on 01/24/2017 3:22:51 AM Also confirmed  by Azalia Bilis ([id number]), editor Barbette Hair [phone])  on 01/24/2017 7:28:54 AM       Radiology Dg Chest 2 View  Result Date: 01/23/2017 CLINICAL DATA:  Acute onset of irregular heartbeat. High blood pressure. EXAM: CHEST  2 VIEW COMPARISON:  None. FINDINGS: The lungs are well-aerated. A 1 cm pleural based nodular density at the right midlung zone is nonspecific. There is no evidence of pleural effusion or pneumothorax. The heart is borderline normal in size. No acute osseous abnormalities are seen. IMPRESSION: Nonspecific 1 cm pleural based nodular density at the right midlung zone. CT of the chest would be helpful for further evaluation on an elective nonemergent basis, as deemed clinically appropriate. Lungs otherwise clear. Electronically Signed   By: Roanna Raider M.D.   On: 01/23/2017 23:23    Procedures Procedures (including critical care time)  Medications Ordered in ED Medications - No  data to display   Initial Impression / Assessment and Plan / ED Course  I have reviewed the triage vital signs and the nursing notes.  Pertinent labs & imaging results that were available during my care of the patient were reviewed by me and considered in my medical decision making (see chart for details).     72 year old female who presents for evaluation of high blood pressure that is been ongoing for the last 2 days.  Patient reports that she has a history of high blood pressure and takes metoprolol 25 mg twice daily.  Patient states that she tries to take her blood pressure medication every day but states that sometimes she will forget it.  Patient reports that over the last 2 days when she has noticed that her blood pressures been high, she has been trying to be compliant with the medication.  Patient reports that when she checks her blood pressure, it usually runs around 140 systolically.  She reports that over the last 2 days her blood pressure has fluctuated between 188-205 systolically.   Patient reports that she gets very anxious and concerned about the blood pressure and repeats it.  Patient denies any chest pain, difficulty breathing, headache, vision changes, numbness/weakness of her arms or legs.  Patient reports that today, the blood pressure continued to elevate, prompting ED visit.  On ED arrival, patient is initially hypertensive at 190/93.  Otherwise pulse and O2 sats were within normal limits.  Throughout ED course, blood pressure has fluctuated.  The highest it got was 214/95.  Patient's blood pressure improved to 180/85 without any intervention.  Throughout my evaluation, patient's blood pressure was ranging from 183/103.  We discussed certain aspects of her health, her blood pressure did elevate.  Concerned that this may be secondary to compliance and anxiety.  Do not suspect hypertensive emergency at this time.  She/physical exam are not concerning for ACS etiology or aortic dissection.  Initial labs ordered at triage.  Troponin negative.  BMP shows slight bump in BUN.  Creatinine is unremarkable.  CBC shows slight leukocytosis of 10.6.  Chest x-ray shows a nonspecific 1 cm pleural density in the right lung.  Recommends further outpatient CT evaluation for further determination.  Discussed patient with Dr. Patria Mane who independently evaluated the patient.  No indications for hypertensive emergency at this time.  Plan to increase her metoprolol.  We will have her follow-up with primary care doctor for further evaluation.  Discussed plan with patient.  Her blood pressure has continued to lower without any intervention.  Patient still denying any chest pain, difficulty breathing, headache, vision changes, numbness/weakness of her arms or legs.  No indication for emergent treatment at this time.  Discussed chest x-ray findings with patient.  Instructed patient to follow-up regarding findings.  Plan for increase in medication.  Patient instructed to follow-up with her primary care doctor  the next 24-48 hours for further evaluation. Patient had ample opportunity for questions and discussion. All patient's questions were answered with full understanding. Strict return precautions discussed. Patient expresses understanding and agreement to plan.    Final Clinical Impressions(s) / ED Diagnoses   Final diagnoses:  Essential hypertension    ED Discharge Orders        Ordered    metoprolol tartrate (LOPRESSOR) 50 MG tablet  2 times daily     01/24/17 0437       Maxwell Caul, PA-C 01/24/17 4098    Azalia Bilis, MD 01/24/17 [phone]

## 2017-02-09 ENCOUNTER — Ambulatory Visit: Payer: Medicare Other | Admitting: Cardiology

## 2017-04-07 NOTE — Progress Notes (Signed)
Cardiology Office Note   Date:  04/09/2017   ID:  FEATHER BERRIE, DOB [birth year], MRN [id number]  PCP:  Abigail Miyamoto, MD  Cardiologist:   Charlton Haws, MD   No chief complaint on file.     History of Present Illness:  72 y.o. f/u MVP, HTN, PAC;s last echo 2012 with prolapse and mild to moderate MR. Norvasc made her jittery. 12/2016 had sinus infection and placed on prednisone which elevated her BP. ? Stomach upset on Toprol.  Did not take BP med this am. Otherwise has been compliant. Discussed spreading meds out during the day  No chest pain, dyspnea palpitations or   Past Medical History:  Diagnosis Date  . Asthma   . CONSTIPATION   . GERD   . HYPERTENSION   . MITRAL VALVE PROLAPSE   . Palpitations   . PREMATURE ATRIAL CONTRACTIONS     Past Surgical History:  Procedure Laterality Date  . BACK SURGERY       Current Outpatient Medications  Medication Sig Dispense Refill  . lisinopril (PRINIVIL,ZESTRIL) 20 MG tablet Take 20 mg by mouth daily.    . metoprolol tartrate (LOPRESSOR) 25 MG tablet Take 1 tablet (25 mg total) by mouth 2 (two) times daily. 180 tablet 3  . traMADol (ULTRAM) 50 MG tablet Take 1 tablet (50 mg total) by mouth every 6 (six) hours as needed. 15 tablet 0   No current facility-administered medications for this visit.     Allergies:   Levaquin [levofloxacin]    Social History:  The patient  reports that  has never smoked. she has never used smokeless tobacco. She reports that she does not drink alcohol.   Family History:  The patient's family history includes Heart disease in her unknown relative.    ROS:  Please see the history of present illness.   Otherwise, review of systems are positive for none.   All other systems are reviewed and negative.    PHYSICAL EXAM: VS:  BP (!) 156/92   Pulse 68   Ht 5' 6.5" (1.689 m)   Wt 159 lb 12 oz (72.5 kg)   SpO2 98%   BMI 25.40 kg/m  , BMI Body mass index is 25.4 kg/m. Affect  appropriate Healthy:  appears stated age HEENT: normal Neck supple with no adenopathy JVP normal no bruits no thyromegaly Lungs clear with no wheezing and good diaphragmatic motion Heart:  S1/S2 no murmur, no rub, gallop or click PMI normal Abdomen: benighn, BS positve, no tenderness, no AAA no bruit.  No HSM or HJR Distal pulses intact with no bruits No edema Neuro non-focal Skin warm and dry No muscular weakness     EKG:  03/21/10 NSR PAC nonspecific ST/T wave changes 02/02/16 SR rate 73 Pac otherwise normal  04/09/17  SR rate 71 normal  Recent Labs: 01/23/2017: BUN 22; Creatinine, Ser 0.92; Hemoglobin 13.0; Platelets 264; Potassium 3.5; Sodium 141    Lipid Panel    Component Value Date/Time   CHOL 238 (H) 02/02/2016 1039   TRIG 105 02/02/2016 1039   HDL 64 02/02/2016 1039   CHOLHDL 3.7 02/02/2016 1039   LDLCALC 153 (H) 02/02/2016 1039      Wt Readings from Last 3 Encounters:  04/09/17 159 lb 12 oz (72.5 kg)  01/23/17 160 lb (72.6 kg)  05/17/16 152 lb 1.9 oz (69 kg)      Other studies Reviewed: Additional studies/ records that were reviewed today include: notes ER ECG and  labs .    ASSESSMENT AND PLAN:  1.  HTN: reactive to anxiety. Previously on norvasc. Upset stomach with Toprol  Continue lopressor and ACE 2. PAC;s benign observe continue beta blocker  3. Mitral Valve Disease     She does not see her primary regularly will order labs including chol, Thyroid and CMP   Current medicines are reviewed at length with the patient today.  The patient does not have concerns regarding medicines.  The following changes have been made:     Labs/ tests ordered today include:    No orders of the defined types were placed in this encounter.    Disposition:   FU with me in a year     Signed, Charlton Hawseter Shayna Eblen, MD  04/09/2017 9:26 AM    [facility] [address], [place], [place]  [address] Phone: [phone]; Fax: [phone]

## 2017-04-09 ENCOUNTER — Encounter: Payer: Self-pay | Admitting: Cardiovascular Disease

## 2017-04-09 ENCOUNTER — Ambulatory Visit: Payer: Medicare Other | Admitting: Cardiovascular Disease

## 2017-04-09 VITALS — BP 156/92 | HR 68 | Ht 66.5 in | Wt 159.8 lb

## 2017-04-09 DIAGNOSIS — I491 Atrial premature depolarization: Secondary | ICD-10-CM

## 2017-04-09 DIAGNOSIS — R002 Palpitations: Secondary | ICD-10-CM | POA: Diagnosis not present

## 2017-04-09 DIAGNOSIS — I10 Essential (primary) hypertension: Secondary | ICD-10-CM | POA: Diagnosis not present

## 2017-04-09 MED ORDER — METOPROLOL TARTRATE 25 MG PO TABS: 25.0000 mg | ORAL_TABLET | Freq: Two times a day (BID) | ORAL | 3 refills | Status: DC

## 2017-04-09 NOTE — Patient Instructions (Addendum)

## 2018-10-29 ENCOUNTER — Encounter: Payer: Self-pay | Admitting: Gastroenterology

## 2019-05-30 DIAGNOSIS — D2310 Other benign neoplasm of skin of unspecified eyelid, including canthus: Secondary | ICD-10-CM | POA: Diagnosis not present

## 2019-05-30 DIAGNOSIS — L821 Other seborrheic keratosis: Secondary | ICD-10-CM | POA: Diagnosis not present

## 2019-05-30 DIAGNOSIS — Z85828 Personal history of other malignant neoplasm of skin: Secondary | ICD-10-CM | POA: Diagnosis not present

## 2019-05-30 DIAGNOSIS — L820 Inflamed seborrheic keratosis: Secondary | ICD-10-CM | POA: Diagnosis not present

## 2019-05-30 DIAGNOSIS — D225 Melanocytic nevi of trunk: Secondary | ICD-10-CM | POA: Diagnosis not present

## 2019-05-30 DIAGNOSIS — D1801 Hemangioma of skin and subcutaneous tissue: Secondary | ICD-10-CM | POA: Diagnosis not present

## 2019-05-30 DIAGNOSIS — L905 Scar conditions and fibrosis of skin: Secondary | ICD-10-CM | POA: Diagnosis not present

## 2019-05-30 DIAGNOSIS — D229 Melanocytic nevi, unspecified: Secondary | ICD-10-CM | POA: Diagnosis not present

## 2019-07-04 DIAGNOSIS — J069 Acute upper respiratory infection, unspecified: Secondary | ICD-10-CM | POA: Diagnosis not present

## 2019-07-14 ENCOUNTER — Ambulatory Visit: Payer: Medicare PPO | Admitting: Legal Medicine

## 2019-07-14 ENCOUNTER — Encounter: Payer: Self-pay | Admitting: Legal Medicine

## 2019-07-14 DIAGNOSIS — J301 Allergic rhinitis due to pollen: Secondary | ICD-10-CM | POA: Insufficient documentation

## 2019-07-14 DIAGNOSIS — I341 Nonrheumatic mitral (valve) prolapse: Secondary | ICD-10-CM | POA: Insufficient documentation

## 2019-07-14 DIAGNOSIS — R911 Solitary pulmonary nodule: Secondary | ICD-10-CM | POA: Insufficient documentation

## 2019-07-14 DIAGNOSIS — J4520 Mild intermittent asthma, uncomplicated: Secondary | ICD-10-CM | POA: Insufficient documentation

## 2019-07-31 DIAGNOSIS — H2513 Age-related nuclear cataract, bilateral: Secondary | ICD-10-CM | POA: Diagnosis not present

## 2019-07-31 DIAGNOSIS — D3131 Benign neoplasm of right choroid: Secondary | ICD-10-CM | POA: Diagnosis not present

## 2019-08-03 ENCOUNTER — Other Ambulatory Visit: Payer: Self-pay

## 2019-08-03 ENCOUNTER — Encounter (HOSPITAL_COMMUNITY): Payer: Self-pay | Admitting: Emergency Medicine

## 2019-08-03 ENCOUNTER — Emergency Department (HOSPITAL_COMMUNITY)
Admission: EM | Admit: 2019-08-03 | Discharge: 2019-08-03 | Disposition: A | Discharge status: Home / Self Care | Payer: Medicare PPO | Attending: Emergency Medicine | Admitting: Emergency Medicine

## 2019-08-03 ENCOUNTER — Emergency Department (HOSPITAL_COMMUNITY): Payer: Medicare PPO

## 2019-08-03 DIAGNOSIS — R5383 Other fatigue: Secondary | ICD-10-CM

## 2019-08-03 DIAGNOSIS — R0789 Other chest pain: Secondary | ICD-10-CM | POA: Diagnosis not present

## 2019-08-03 DIAGNOSIS — Z79899 Other long term (current) drug therapy: Secondary | ICD-10-CM | POA: Diagnosis not present

## 2019-08-03 DIAGNOSIS — J45909 Unspecified asthma, uncomplicated: Secondary | ICD-10-CM | POA: Diagnosis not present

## 2019-08-03 DIAGNOSIS — R531 Weakness: Secondary | ICD-10-CM | POA: Diagnosis not present

## 2019-08-03 DIAGNOSIS — I10 Essential (primary) hypertension: Secondary | ICD-10-CM

## 2019-08-03 LAB — CBG MONITORING, ED: Glucose-Capillary: 89 mg/dL (ref 70–99)

## 2019-08-03 LAB — CBC
HCT: 42.3 % (ref 36.0–46.0)
Hemoglobin: 13.6 g/dL (ref 12.0–15.0)
MCH: 29.6 pg (ref 26.0–34.0)
MCHC: 32.2 g/dL (ref 30.0–36.0)
MCV: 92.2 fL (ref 80.0–100.0)
Platelets: 260 10*3/uL (ref 150–400)
RBC: 4.59 MIL/uL (ref 3.87–5.11)
RDW: 11.9 % (ref 11.5–15.5)
WBC: 8.9 10*3/uL (ref 4.0–10.5)
nRBC: 0 % (ref 0.0–0.2)

## 2019-08-03 LAB — BASIC METABOLIC PANEL
Anion gap: 10 (ref 5–15)
BUN: 21 mg/dL (ref 8–23)
CO2: 25 mmol/L (ref 22–32)
Calcium: 9.5 mg/dL (ref 8.9–10.3)
Chloride: 106 mmol/L (ref 98–111)
Creatinine, Ser: 0.99 mg/dL (ref 0.44–1.00)
GFR calc Af Amer: >60 mL/min (ref >60)
GFR calc non Af Amer: 56 mL/min — ABNORMAL LOW (ref >60)
Glucose, Bld: 107 mg/dL — ABNORMAL HIGH (ref 70–99)
Potassium: 3.8 mmol/L (ref 3.5–5.1)
Sodium: 141 mmol/L (ref 135–145)

## 2019-08-03 LAB — TROPONIN I (HIGH SENSITIVITY): Troponin I (High Sensitivity): 6 ng/L (ref <18)

## 2019-08-03 MED ORDER — PREDNISONE 20 MG PO TABS: 60.0000 mg | ORAL_TABLET | Freq: Once | ORAL | Status: DC

## 2019-08-03 MED ORDER — SODIUM CHLORIDE 0.9% FLUSH: 3.0000 mL | Freq: Once | INTRAVENOUS | Status: DC

## 2019-08-03 MED ORDER — PREDNISONE 50 MG PO TABS
ORAL_TABLET | ORAL | 0 refills | Status: DC
Start: 2019-08-03 — End: 2019-08-12

## 2019-08-03 MED ORDER — METOPROLOL TARTRATE 25 MG PO TABS: 25.0000 mg | ORAL_TABLET | Freq: Two times a day (BID) | ORAL | 3 refills | Status: DC

## 2019-08-03 MED ORDER — ALBUTEROL SULFATE HFA 108 (90 BASE) MCG/ACT IN AERS: 2.0000 | INHALATION_SPRAY | Freq: Once | RESPIRATORY_TRACT | Status: DC

## 2019-08-03 MED ORDER — PREDNISONE 20 MG PO TABS: 60.0000 mg | ORAL_TABLET | Freq: Every day | ORAL | Status: DC

## 2019-08-03 NOTE — ED Notes (Signed)
No sob at present 

## 2019-08-03 NOTE — Discharge Instructions (Signed)
Call your Physician tomorrow to be seen for evalatuion.

## 2019-08-03 NOTE — ED Triage Notes (Addendum)
C/o generalized weakness since yesterday.  Denies pain or any other symptoms.  No arm drift.

## 2019-08-03 NOTE — ED Provider Notes (Signed)
[facility] EMERGENCY DEPARTMENT Provider Note   CSN: [id number] Arrival date & time: 08/03/19  1037     History Chief Complaint  Patient presents with   Weakness    [name] [name] is a 74 y.o. female.  HPI  Patient is a 74 year old female with a history of hypertension, asthma, allergic rhinitis, palpitations, MVP, PACs.  Patient presents today due to fatigue and chest tightness.  She states she was out in her yard yesterday working in the sun.  She came in and was feeling more short of breath than normal.  She does have a history of asthma and very rarely uses albuterol.  She states she used her albuterol inhaler yesterday with mild relief.  She woke this morning with continued mild chest tightness that is in the central part of her chest as well as mild shortness of breath.  Her symptoms worsen with exertion.  No chest pain. She notes that she took lisinopril in the past for hypertension but was taken off this medication over a year ago due to significant fatigue secondary to the medication.  She has not been placed on a new hypertension medication since.  Patient also states that she had a mild diffuse headache yesterday.  She took 81 mg of aspirin at that time which provided mild relief.  No n/v/d/c, fevers, chills, syncope, dizziness.      Past Medical History:  Diagnosis Date   Allergic rhinitis due to pollen    Asthma    CONSTIPATION    Essential hypertension 09/21/2008   Qualifier: Diagnosis of  By: Kem Parkinson     GERD    HYPERTENSION    MITRAL VALVE PROLAPSE    Nonrheumatic mitral (valve) prolapse    Palpitations    PREMATURE ATRIAL CONTRACTIONS    Solitary pulmonary nodule     Patient Active Problem List   Diagnosis Date Noted   Mild intermittent asthma, uncomplicated    Nonrheumatic mitral (valve) prolapse    Solitary pulmonary nodule    Allergic rhinitis due to pollen    GERD 10/26/2008   Essential hypertension  09/21/2008   MITRAL VALVE PROLAPSE 09/21/2008    Past Surgical History:  Procedure Laterality Date   BACK SURGERY     LAPAROSCOPIC HYSTERECTOMY       OB History   No obstetric history on file.     Family History  Problem Relation Age of Onset   Heart disease Other    Hypertension Mother    Parkinson's disease Father     Social History   Tobacco Use   Smoking status: Never Smoker   Smokeless tobacco: Never Used  Substance Use Topics   Alcohol use: No   Drug use: Not on file    Home Medications Prior to Admission medications   Medication Sig Start Date End Date Taking? Authorizing Provider  lisinopril (PRINIVIL,ZESTRIL) 20 MG tablet Take 20 mg by mouth daily.    [provider]  metoprolol tartrate (LOPRESSOR) 25 MG tablet Take 1 tablet (25 mg total) by mouth 2 (two) times daily. 04/09/17   Wendall Stade, MD  traMADol (ULTRAM) 50 MG tablet Take 1 tablet (50 mg total) by mouth every 6 (six) hours as needed. 10/04/16   Wallis Bamberg, PA-C    Allergies    Levaquin [levofloxacin], Lisinopril, and Erythromycin  Review of Systems   Review of Systems  All other systems reviewed and are negative. Ten systems reviewed and are negative for acute change,  except as noted in the HPI.    Physical Exam Updated Vital Signs BP (!) 172/81 (BP Location: Right Arm)    Pulse 81    Temp 98.6 F (37 C) (Oral)    Resp 16    Ht 5\' 7"  (1.702 m)    Wt 72.6 kg    SpO2 100%    BMI 25.06 kg/m   Physical Exam Vitals and nursing note reviewed.  Constitutional:      General: She is not in acute distress.    Appearance: Normal appearance. She is not ill-appearing, toxic-appearing or diaphoretic.  HENT:     Head: Normocephalic and atraumatic.     Right Ear: Tympanic membrane, ear canal and external ear normal.     Left Ear: Tympanic membrane, ear canal and external ear normal.     Ears:     Comments: Bilateral middle ear effusions.  Nonbulging TMs.    Nose: Nose normal.       Mouth/Throat:     Mouth: Mucous membranes are moist.     Pharynx: Oropharynx is clear. No oropharyngeal exudate or posterior oropharyngeal erythema.  Eyes:     General: No scleral icterus.       Right eye: No discharge.        Left eye: No discharge.     Extraocular Movements: Extraocular movements intact.     Conjunctiva/sclera: Conjunctivae normal.     Pupils: Pupils are equal, round, and reactive to light.  Cardiovascular:     Rate and Rhythm: Normal rate and regular rhythm.     Pulses: Normal pulses.     Heart sounds: Normal heart sounds. No murmur heard.  No friction rub. No gallop.   Pulmonary:     Effort: Pulmonary effort is normal. No respiratory distress.     Breath sounds: Normal breath sounds. No stridor. No wheezing, rhonchi or rales.  Abdominal:     General: Abdomen is flat.     Tenderness: There is no abdominal tenderness.  Musculoskeletal:        General: Normal range of motion.     Cervical back: Normal range of motion and neck supple. No tenderness.  Skin:    General: Skin is warm and dry.  Neurological:     General: No focal deficit present.     Mental Status: She is alert and oriented to person, place, and time.     Comments: Patient is oriented to person, place, and time. Patient phonates in clear, complete, and coherent sentences. Negative arm drift. Finger to nose intact bilaterally with no visible signs of dysmetria. Strength is 5/5 in all four extremities. Distal sensation intact in all four extremities.  Patient can ambulate with a steady gait.  Psychiatric:        Mood and Affect: Mood normal.        Behavior: Behavior normal.    ED Results / Procedures / Treatments   Labs (all labs ordered are listed, but only abnormal results are displayed) Labs Reviewed  BASIC METABOLIC PANEL - Abnormal; Notable for the following components:      Result Value   Glucose, Bld 107 (*)    GFR calc non Af Amer 56 (*)    All other components within normal limits   CBC  URINALYSIS, ROUTINE W REFLEX MICROSCOPIC  CBG MONITORING, ED  TROPONIN I (HIGH SENSITIVITY)   EKG None  Radiology DG Chest Portable 1 View  Result Date: 08/03/2019 CLINICAL DATA:  Chest tightness. EXAM: PORTABLE CHEST  1 VIEW COMPARISON:  January 23, 2017 FINDINGS: The heart size and mediastinal contours are within normal limits. Both lungs are clear. The visualized skeletal structures are unremarkable. IMPRESSION: No active disease. Electronically Signed   By: Gerome Sam III M.D   On: 08/03/2019 15:13   Procedures Procedures (including critical care time)  Medications Ordered in ED Medications  sodium chloride flush (NS) 0.9 % injection 3 mL (has no administration in time range)    ED Course  I have reviewed the triage vital signs and the nursing notes.  Pertinent labs & imaging results that were available during my care of the patient were reviewed by me and considered in my medical decision making (see chart for details).  Clinical Course as of Aug 02 1533  Wynelle Link Aug 03, 2019  1533 Glucose(!): 107 [LJ]  1533 Glucose-Capillary: 89 [LJ]  1533 Hemoglobin: 13.6 [LJ]  1533 No acute abnormalities  DG Chest Portable 1 View [LJ]  1534 No electrolyte abnormalities  Basic metabolic panel(!) [LJ]    Clinical Course User Index [LJ] Placido Sou, PA-C   MDM Rules/Calculators/A&P                          Pt is a 74 y.o. female that present with a history, physical exam, ED Clinical Course as noted above.   Patient presents today with fatigue, mild central chest tightness and dyspnea on exertion.  Initial labs obtained in triage and were unremarkable.  I obtained a chest x-ray which is negative.  Troponin is pending.  It is the end of my shift and patient care will be transferred to [place], [place].   Note: Portions of this report may have been transcribed using voice recognition software. Every effort was made to ensure accuracy; however, inadvertent computerized  transcription errors may be present.    Final Clinical Impression(s) / ED Diagnoses Final diagnoses:  Fatigue, unspecified type  Chest tightness   Rx / DC Orders ED Discharge Orders    None       Placido Sou, PA-C 08/03/19 1538    Tilden Fossa, MD 08/04/19 1252

## 2019-08-12 ENCOUNTER — Other Ambulatory Visit: Payer: Self-pay

## 2019-08-12 ENCOUNTER — Ambulatory Visit (INDEPENDENT_AMBULATORY_CARE_PROVIDER_SITE_OTHER): Payer: Medicare PPO | Admitting: Legal Medicine

## 2019-08-12 ENCOUNTER — Encounter: Payer: Self-pay | Admitting: Legal Medicine

## 2019-08-12 VITALS — BP 220/100 | HR 87 | Temp 97.3°F | Resp 18 | Ht 67.0 in | Wt 160.2 lb

## 2019-08-12 DIAGNOSIS — I10 Essential (primary) hypertension: Secondary | ICD-10-CM

## 2019-08-12 MED ORDER — METOPROLOL TARTRATE 25 MG PO TABS: 25.0000 mg | ORAL_TABLET | Freq: Two times a day (BID) | ORAL | 2 refills | Status: DC

## 2019-08-12 NOTE — Progress Notes (Signed)
Subjective:  Patient ID: [name] [name], female    DOB: [birth year]  Age: 74 y.o. MRN: [id number]  Chief Complaint  Patient presents with   Hypertension   Rash    HPI: follow up from emergency room.  Patient presents for follow up of hypertension.  Patient tolerating off all medicines well with side effects.  Patient was diagnosed with hypertension 2010 so has been treated for hypertension for 10 years.Patient is working on maintaining diet and exercise regimen and follows up as directed. Complication include none.  She stopped her medicines.  BP now high.     Current Outpatient Medications on File Prior to Visit  Medication Sig Dispense Refill   lisinopril (PRINIVIL,ZESTRIL) 20 MG tablet Take 20 mg by mouth daily.     No current facility-administered medications on file prior to visit.   Past Medical History:  Diagnosis Date   Allergic rhinitis due to pollen    Asthma    CONSTIPATION    Essential hypertension 09/21/2008   Qualifier: Diagnosis of  By: Kem Parkinson     GERD    HYPERTENSION    MITRAL VALVE PROLAPSE    Nonrheumatic mitral (valve) prolapse    Palpitations    PREMATURE ATRIAL CONTRACTIONS    Solitary pulmonary nodule    Past Surgical History:  Procedure Laterality Date   BACK SURGERY     LAPAROSCOPIC HYSTERECTOMY      Family History  Problem Relation Age of Onset   Heart disease Other    Hypertension Mother    Parkinson's disease Father    Social History   Socioeconomic History   Marital status: Married    Spouse name: Not on file   Number of children: Not on file   Years of education: Not on file   Highest education level: Not on file  Occupational History   Not on file  Tobacco Use   Smoking status: Never Smoker   Smokeless tobacco: Never Used  Substance and Sexual Activity   Alcohol use: No   Drug use: Not on file   Sexual activity: Not on file  Other Topics Concern   Not on file  Social History  Narrative   Not on file   Social Determinants of Health   Financial Resource Strain:    Difficulty of Paying Living Expenses:   Food Insecurity:    Worried About Programme researcher, broadcasting/film/video in the Last Year:    Barista in the Last Year:   Transportation Needs:    Freight forwarder (Medical):    Lack of Transportation (Non-Medical):   Physical Activity:    Days of Exercise per Week:    Minutes of Exercise per Session:   Stress:    Feeling of Stress :   Social Connections:    Frequency of Communication with Friends and Family:    Frequency of Social Gatherings with Friends and Family:    Attends Religious Services:    Active Member of Clubs or Organizations:    Attends Banker Meetings:    Marital Status:     Review of Systems  Constitutional: Positive for fatigue.  HENT: Negative.   Eyes: Negative.   Respiratory: Negative.   Cardiovascular: Negative.   Gastrointestinal: Negative.   Genitourinary: Negative.   Musculoskeletal: Negative.   Skin: Negative.   Neurological: Negative.   Psychiatric/Behavioral: Negative.      Objective:  BP (!) 220/100    Pulse 87    Temp (!)  97.3 F (36.3 C)    Resp 18    Ht 5\' 7"  (1.702 m)    Wt 160 lb 3.2 oz (72.7 kg)    SpO2 98%    BMI 25.09 kg/m   BP/Weight 08/12/2019 08/03/2019 04/09/2017  Systolic BP 220 168 156  Diastolic BP 100 85 92  Wt. (Lbs) 160.2 160.05 159.75  BMI 25.09 25.07 25.4    Physical Exam Vitals reviewed.  Constitutional:      Appearance: Normal appearance.  HENT:     Head: Normocephalic and atraumatic.     Right Ear: Tympanic membrane, ear canal and external ear normal.     Left Ear: Tympanic membrane, ear canal and external ear normal.     Nose: Nose normal.     Mouth/Throat:     Mouth: Mucous membranes are moist.  Eyes:     Extraocular Movements: Extraocular movements intact.     Conjunctiva/sclera: Conjunctivae normal.     Pupils: Pupils are equal, round, and reactive  to light.  Cardiovascular:     Rate and Rhythm: Normal rate and regular rhythm.     Pulses: Normal pulses.     Heart sounds: Normal heart sounds.  Pulmonary:     Effort: Pulmonary effort is normal.     Breath sounds: Normal breath sounds.  Neurological:     Mental Status: She is alert.       Lab Results  Component Value Date   WBC 8.9 08/03/2019   HGB 13.6 08/03/2019   HCT 42.3 08/03/2019   PLT 260 08/03/2019   GLUCOSE 107 (H) 08/03/2019   CHOL 238 (H) 02/02/2016   TRIG 105 02/02/2016   HDL 64 02/02/2016   LDLCALC 153 (H) 02/02/2016   ALT 17 02/02/2016   AST 23 02/02/2016   NA 141 08/03/2019   K 3.8 08/03/2019   CL 106 08/03/2019   CREATININE 0.99 08/03/2019   BUN 21 08/03/2019   CO2 25 08/03/2019   TSH 1.290 02/02/2016   HGBA1C 5.5 02/02/2016      Assessment & Plan:   1. Essential hypertension - metoprolol tartrate (LOPRESSOR) 25 MG tablet; Take 1 tablet (25 mg total) by mouth 2 (two) times daily.  Dispense: 180 tablet; Refill: 2 - Comprehensive metabolic panel Restart metoprolol bid, recheck in 2 weeks and adjust for BP level   Meds ordered this encounter  Medications   metoprolol tartrate (LOPRESSOR) 25 MG tablet    Sig: Take 1 tablet (25 mg total) by mouth 2 (two) times daily.    Dispense:  180 tablet    Refill:  2    Orders Placed This Encounter  Procedures   Comprehensive metabolic panel     Follow-up: Return in about 2 weeks (around 08/26/2019) for for bp.  An After Visit Summary was printed and given to the patient.  10/26/2019 [facility] [phone]

## 2019-08-13 LAB — COMPREHENSIVE METABOLIC PANEL
ALT: 15 IU/L (ref 0–32)
AST: 19 IU/L (ref 0–40)
Albumin/Globulin Ratio: 1.7 (ref 1.2–2.2)
Albumin: 4.1 g/dL (ref 3.7–4.7)
Alkaline Phosphatase: 64 IU/L (ref 48–121)
BUN/Creatinine Ratio: 23 (ref 12–28)
BUN: 25 mg/dL (ref 8–27)
Bilirubin Total: 0.3 mg/dL (ref 0.0–1.2)
CO2: 23 mmol/L (ref 20–29)
Calcium: 9.6 mg/dL (ref 8.7–10.3)
Chloride: 106 mmol/L (ref 96–106)
Creatinine, Ser: 1.08 mg/dL — ABNORMAL HIGH (ref 0.57–1.00)
GFR calc Af Amer: 58 mL/min/{1.73_m2} — ABNORMAL LOW (ref >59)
GFR calc non Af Amer: 50 mL/min/{1.73_m2} — ABNORMAL LOW (ref >59)
Globulin, Total: 2.4 g/dL (ref 1.5–4.5)
Glucose: 85 mg/dL (ref 65–99)
Potassium: 4.1 mmol/L (ref 3.5–5.2)
Sodium: 144 mmol/L (ref 134–144)
Total Protein: 6.5 g/dL (ref 6.0–8.5)

## 2019-08-13 NOTE — Progress Notes (Signed)
Kidney tests stable, potassium 4.1 normal, liver tests normal lp

## 2019-08-26 ENCOUNTER — Other Ambulatory Visit: Payer: Self-pay

## 2019-08-26 ENCOUNTER — Ambulatory Visit (INDEPENDENT_AMBULATORY_CARE_PROVIDER_SITE_OTHER): Payer: Medicare PPO | Admitting: Legal Medicine

## 2019-08-26 ENCOUNTER — Encounter: Payer: Self-pay | Admitting: Legal Medicine

## 2019-08-26 VITALS — BP 140/78 | HR 77 | Temp 97.9°F | Resp 18 | Ht 67.0 in | Wt 163.2 lb

## 2019-08-26 DIAGNOSIS — I10 Essential (primary) hypertension: Secondary | ICD-10-CM | POA: Diagnosis not present

## 2019-08-26 DIAGNOSIS — Z6825 Body mass index (BMI) 25.0-25.9, adult: Secondary | ICD-10-CM | POA: Diagnosis not present

## 2019-08-26 DIAGNOSIS — Z789 Other specified health status: Secondary | ICD-10-CM | POA: Diagnosis not present

## 2019-08-26 MED ORDER — AMLODIPINE BESYLATE 10 MG PO TABS: 10.0000 mg | ORAL_TABLET | Freq: Every day | ORAL | 3 refills | Status: DC

## 2019-08-26 NOTE — Progress Notes (Signed)
Subjective:  Patient ID: [name] [name], female    DOB: October 20, 1945  Age: 74 y.o. MRN: [id number]  Chief Complaint  Patient presents with  . Hypertension    HPI: follow up on hypertension  Patient presents for follow up of hypertension.  Patient tolerating metoprolol well with side effects.  Patient was diagnosed with hypertension 2010 so has been treated for hypertension for 10 years.Patient is working on maintaining diet and exercise regimen and follows up as directed. Complication include none   Had hives with lisinopril, she is very lethargic on metoprolol..   No current outpatient medications on file prior to visit.   No current facility-administered medications on file prior to visit.   Past Medical History:  Diagnosis Date  . Allergic rhinitis due to pollen   . Asthma   . CONSTIPATION   . Essential hypertension 09/21/2008   Qualifier: Diagnosis of  By: Kem Parkinson    . GERD   . HYPERTENSION   . MITRAL VALVE PROLAPSE   . Nonrheumatic mitral (valve) prolapse   . Palpitations   . PREMATURE ATRIAL CONTRACTIONS   . Solitary pulmonary nodule    Past Surgical History:  Procedure Laterality Date  . BACK SURGERY    . LAPAROSCOPIC HYSTERECTOMY      Family History  Problem Relation Age of Onset  . Heart disease Other   . Hypertension Mother   . Parkinson's disease Father    Social History   Socioeconomic History  . Marital status: Married    Spouse name: Not on file  . Number of children: Not on file  . Years of education: Not on file  . Highest education level: Not on file  Occupational History  . Not on file  Tobacco Use  . Smoking status: Never Smoker  . Smokeless tobacco: Never Used  Substance and Sexual Activity  . Alcohol use: No  . Drug use: Not on file  . Sexual activity: Not on file  Other Topics Concern  . Not on file  Social History Narrative  . Not on file   Social Determinants of Health   Financial Resource Strain:   . Difficulty of  Paying Living Expenses:   Food Insecurity:   . Worried About Programme researcher, broadcasting/film/video in the Last Year:   . Barista in the Last Year:   Transportation Needs:   . Freight forwarder (Medical):   Marland Kitchen Lack of Transportation (Non-Medical):   Physical Activity:   . Days of Exercise per Week:   . Minutes of Exercise per Session:   Stress:   . Feeling of Stress :   Social Connections:   . Frequency of Communication with Friends and Family:   . Frequency of Social Gatherings with Friends and Family:   . Attends Religious Services:   . Active Member of Clubs or Organizations:   . Attends Banker Meetings:   Marland Kitchen Marital Status:     Review of Systems  Constitutional: Negative.   HENT: Negative.   Eyes: Negative.   Respiratory: Negative.   Cardiovascular: Negative.   Gastrointestinal: Negative.   Genitourinary: Negative.   Musculoskeletal: Negative.   Skin: Negative.   Neurological: Negative.      Objective:  BP 140/78   Pulse 77   Temp 97.9 F (36.6 C)   Resp 18   Ht 5\' 7"  (1.702 m)   Wt 163 lb 3.2 oz (74 kg)   SpO2 98%   BMI 25.56 kg/m  BP/Weight 08/26/2019 08/12/2019 08/03/2019  Systolic BP 140 220 168  Diastolic BP 78 100 85  Wt. (Lbs) 163.2 160.2 160.05  BMI 25.56 25.09 25.07    Physical Exam Vitals reviewed.  HENT:     Right Ear: Tympanic membrane normal.     Left Ear: Tympanic membrane normal.  Cardiovascular:     Rate and Rhythm: Normal rate and regular rhythm.     Pulses: Normal pulses.     Heart sounds: Normal heart sounds.  Pulmonary:     Effort: Pulmonary effort is normal.     Breath sounds: Normal breath sounds.  Musculoskeletal:        General: Normal range of motion.     Cervical back: Normal range of motion and neck supple.  Neurological:     General: No focal deficit present.     Mental Status: She is alert and oriented to person, place, and time. Mental status is at baseline.       Lab Results  Component Value Date    WBC 8.9 08/03/2019   HGB 13.6 08/03/2019   HCT 42.3 08/03/2019   PLT 260 08/03/2019   GLUCOSE 85 08/12/2019   CHOL 238 (H) 02/02/2016   TRIG 105 02/02/2016   HDL 64 02/02/2016   LDLCALC 153 (H) 02/02/2016   ALT 15 08/12/2019   AST 19 08/12/2019   NA 144 08/12/2019   K 4.1 08/12/2019   CL 106 08/12/2019   CREATININE 1.08 (H) 08/12/2019   BUN 25 08/12/2019   CO2 23 08/12/2019   TSH 1.290 02/02/2016   HGBA1C 5.5 02/02/2016      Assessment & Plan:   1. Essential hypertension - amLODipine (NORVASC) 10 MG tablet; Take 1 tablet (10 mg total) by mouth daily.  Dispense: 90 tablet; Refill: 3 An individual hypertension care plan was established and reinforced today.  The patient's status was assessed using clinical findings on exam and labs or diagnostic tests. The patient's success at meeting treatment goals on disease specific evidence-based guidelines and found to be fair controlled. SELF MANAGEMENT: The patient and I together assessed ways to personally work towards obtaining the recommended goals. Stop metoprolol and start norvasc RECOMMENDATIONS: avoid decongestants found in common cold remedies, decrease consumption of alcohol, perform routine monitoring of BP with home BP cuff, exercise, reduction of dietary salt, take medicines as prescribed, try not to miss doses and quit smoking.  Regular exercise and maintaining a healthy weight is needed.  Stress reduction may help. A CLINICAL SUMMARY including written plan identify barriers to care unique to individual due to social or financial issues.  We attempt to mutually creat solutions for individual and family understanding. 2. Unknown status of immunity to COVID-19 virus - SARS-CoV-2 Antibodies Patient had immunizations and wants to know her antibody status  3. BMI 25.0-25.9,adult Continue diet and exercise   Meds ordered this encounter  Medications  . amLODipine (NORVASC) 10 MG tablet    Sig: Take 1 tablet (10 mg total) by mouth  daily.    Dispense:  90 tablet    Refill:  3    Orders Placed This Encounter  Procedures  . SARS-CoV-2 Antibodies     Follow-up: Return in about 2 weeks (around 09/09/2019).  An After Visit Summary was printed and given to the patient.  Brent Bulla [facility] [phone]

## 2019-08-27 LAB — SARS-COV-2 ANTIBODIES: SARS-CoV-2 Antibodies: NEGATIVE

## 2019-08-27 NOTE — Progress Notes (Signed)
Our usual test for Covid antibodies is negative but I found out this is not the correct test, [facility] does not do the appropriate antibody test so we should not charge you for this. lp

## 2019-09-03 ENCOUNTER — Other Ambulatory Visit: Payer: Self-pay

## 2019-09-03 ENCOUNTER — Encounter: Payer: Self-pay | Admitting: Legal Medicine

## 2019-09-03 ENCOUNTER — Ambulatory Visit (INDEPENDENT_AMBULATORY_CARE_PROVIDER_SITE_OTHER): Payer: Medicare PPO | Admitting: Legal Medicine

## 2019-09-03 VITALS — BP 162/90 | HR 90 | Temp 97.6°F | Resp 17 | Ht 67.0 in | Wt 160.0 lb

## 2019-09-03 DIAGNOSIS — K529 Noninfective gastroenteritis and colitis, unspecified: Secondary | ICD-10-CM

## 2019-09-03 DIAGNOSIS — I10 Essential (primary) hypertension: Secondary | ICD-10-CM

## 2019-09-03 LAB — POCT URINALYSIS DIP (CLINITEK)
Bilirubin, UA: NEGATIVE
Glucose, UA: NEGATIVE mg/dL
Ketones, POC UA: NEGATIVE mg/dL
Leukocytes, UA: NEGATIVE
Nitrite, UA: NEGATIVE
POC PROTEIN,UA: NEGATIVE
Spec Grav, UA: 1.01 (ref 1.010–1.025)
Urobilinogen, UA: 0.2 E.U./dL
pH, UA: 6 (ref 5.0–8.0)

## 2019-09-03 NOTE — Progress Notes (Signed)
Subjective:  Patient ID: [name] [name], female    DOB: [birth year]  Age: 74 y.o. MRN: [id number]  Chief Complaint  Patient presents with  . Diarrhea    Patient had diarrhea last 2 days ago.  . Fatigue    Patient has been tired.    HPI: patient had diarrhea earlier this week after eating at [place].  It I stopped.  She started amlodipine 1 week ago.  Patient still feels weak and having leg cramps.   Current Outpatient Medications on File Prior to Visit  Medication Sig Dispense Refill  . amLODipine (NORVASC) 10 MG tablet Take 1 tablet (10 mg total) by mouth daily. 90 tablet 3   No current facility-administered medications on file prior to visit.   Past Medical History:  Diagnosis Date  . Allergic rhinitis due to pollen   . Asthma   . CONSTIPATION   . Essential hypertension 09/21/2008   Qualifier: Diagnosis of  By: Kem Parkinson    . GERD   . HYPERTENSION   . MITRAL VALVE PROLAPSE   . Nonrheumatic mitral (valve) prolapse   . Palpitations   . PREMATURE ATRIAL CONTRACTIONS   . Solitary pulmonary nodule    Past Surgical History:  Procedure Laterality Date  . BACK SURGERY    . LAPAROSCOPIC HYSTERECTOMY      Family History  Problem Relation Age of Onset  . Heart disease Other   . Hypertension Mother   . Parkinson's disease Father    Social History   Socioeconomic History  . Marital status: Married    Spouse name: Not on file  . Number of children: Not on file  . Years of education: Not on file  . Highest education level: Not on file  Occupational History  . Not on file  Tobacco Use  . Smoking status: Never Smoker  . Smokeless tobacco: Never Used  Substance and Sexual Activity  . Alcohol use: No  . Drug use: Not on file  . Sexual activity: Not on file  Other Topics Concern  . Not on file  Social History Narrative  . Not on file   Social Determinants of Health   Financial Resource Strain:   . Difficulty of Paying Living Expenses:   Food Insecurity:    . Worried About Programme researcher, broadcasting/film/video in the Last Year:   . Barista in the Last Year:   Transportation Needs:   . Freight forwarder (Medical):   Marland Kitchen Lack of Transportation (Non-Medical):   Physical Activity:   . Days of Exercise per Week:   . Minutes of Exercise per Session:   Stress:   . Feeling of Stress :   Social Connections:   . Frequency of Communication with Friends and Family:   . Frequency of Social Gatherings with Friends and Family:   . Attends Religious Services:   . Active Member of Clubs or Organizations:   . Attends Banker Meetings:   Marland Kitchen Marital Status:     Review of Systems  Constitutional: Positive for fatigue.  HENT: Negative.   Eyes: Negative.   Respiratory: Negative.   Cardiovascular: Negative.   Gastrointestinal: Positive for diarrhea.  Endocrine: Negative.   Genitourinary: Negative.   Musculoskeletal: Negative.   Neurological: Negative.   Psychiatric/Behavioral: Negative.      Objective:  BP (!) 162/90 (BP Location: Left Arm, Patient Position: Sitting)   Pulse 90   Temp 97.6 F (36.4 C) (Temporal)   Resp 17  Ht 5\' 7"  (1.702 m)   Wt 160 lb (72.6 kg)   SpO2 97%   BMI 25.06 kg/m   BP/Weight 09/03/2019 08/26/2019 08/12/2019  Systolic BP 162 140 220  Diastolic BP 90 78 100  Wt. (Lbs) 160 163.2 160.2  BMI 25.06 25.56 25.09    Physical Exam Vitals reviewed.  Constitutional:      Appearance: Normal appearance.  Eyes:     Extraocular Movements: Extraocular movements intact.     Conjunctiva/sclera: Conjunctivae normal.     Pupils: Pupils are equal, round, and reactive to light.  Cardiovascular:     Rate and Rhythm: Normal rate and regular rhythm.     Pulses: Normal pulses.     Heart sounds: Normal heart sounds.  Pulmonary:     Effort: Pulmonary effort is normal.     Breath sounds: Normal breath sounds.  Abdominal:     General: Abdomen is flat. Bowel sounds are normal.     Palpations: Abdomen is soft.   Musculoskeletal:     Cervical back: Normal range of motion and neck supple.  Neurological:     Mental Status: She is alert and oriented to person, place, and time.        Lab Results  Component Value Date   WBC 8.9 08/03/2019   HGB 13.6 08/03/2019   HCT 42.3 08/03/2019   PLT 260 08/03/2019   GLUCOSE 85 08/12/2019   CHOL 238 (H) 02/02/2016   TRIG 105 02/02/2016   HDL 64 02/02/2016   LDLCALC 153 (H) 02/02/2016   ALT 15 08/12/2019   AST 19 08/12/2019   NA 144 08/12/2019   K 4.1 08/12/2019   CL 106 08/12/2019   CREATININE 1.08 (H) 08/12/2019   BUN 25 08/12/2019   CO2 23 08/12/2019   TSH 1.290 02/02/2016   HGBA1C 5.5 02/02/2016      Assessment & Plan:   Diagnoses and all orders for this visit: Essential hypertension -     Comprehensive metabolic panel An individual hypertension care plan was established and reinforced today.  The patient's status was assessed using clinical findings on exam and labs or diagnostic tests. The patient's success at meeting treatment goals on disease specific evidence-based guidelines and found to be fair controlled. Hold amlodipine for now SELF MANAGEMENT: The patient and I together assessed ways to personally work towards obtaining the recommended goals. RECOMMENDATIONS: avoid decongestants found in common cold remedies, decrease consumption of alcohol, perform routine monitoring of BP with home BP cuff, exercise, reduction of dietary salt, take medicines as prescribed, try not to miss doses and quit smoking.  Regular exercise and maintaining a healthy weight is needed.  Stress reduction may help. A CLINICAL SUMMARY including written plan identify barriers to care unique to individual due to social or financial issues.  We attempt to mutually creat solutions for individual and family understanding. Gastroenteritis Gastroenteritis has resolved but se may have some dehydration of hypokalemia     Orders Placed This Encounter  Procedures  .  Comprehensive metabolic panel     Follow-up: Return if symptoms worsen or fail to improve.  An After Visit Summary was printed and given to the patient.  04/01/2016 [facility] [phone]

## 2019-09-04 LAB — COMPREHENSIVE METABOLIC PANEL
ALT: 20 IU/L (ref 0–32)
AST: 26 IU/L (ref 0–40)
Albumin/Globulin Ratio: 1.8 (ref 1.2–2.2)
Albumin: 4.3 g/dL (ref 3.7–4.7)
Alkaline Phosphatase: 72 IU/L (ref 48–121)
BUN/Creatinine Ratio: 19 (ref 12–28)
BUN: 21 mg/dL (ref 8–27)
Bilirubin Total: 0.3 mg/dL (ref 0.0–1.2)
CO2: 25 mmol/L (ref 20–29)
Calcium: 10 mg/dL (ref 8.7–10.3)
Chloride: 104 mmol/L (ref 96–106)
Creatinine, Ser: 1.1 mg/dL — ABNORMAL HIGH (ref 0.57–1.00)
GFR calc Af Amer: 56 mL/min/{1.73_m2} — ABNORMAL LOW (ref >59)
GFR calc non Af Amer: 49 mL/min/{1.73_m2} — ABNORMAL LOW (ref >59)
Globulin, Total: 2.4 g/dL (ref 1.5–4.5)
Glucose: 94 mg/dL (ref 65–99)
Potassium: 4.5 mmol/L (ref 3.5–5.2)
Sodium: 141 mmol/L (ref 134–144)
Total Protein: 6.7 g/dL (ref 6.0–8.5)

## 2019-09-04 NOTE — Progress Notes (Signed)
Kidney tests stable, liver normal, potassium 4.5 good lp

## 2019-09-05 DIAGNOSIS — H25013 Cortical age-related cataract, bilateral: Secondary | ICD-10-CM | POA: Diagnosis not present

## 2019-09-05 DIAGNOSIS — H18413 Arcus senilis, bilateral: Secondary | ICD-10-CM | POA: Diagnosis not present

## 2019-09-05 DIAGNOSIS — H353132 Nonexudative age-related macular degeneration, bilateral, intermediate dry stage: Secondary | ICD-10-CM | POA: Diagnosis not present

## 2019-09-05 DIAGNOSIS — H25043 Posterior subcapsular polar age-related cataract, bilateral: Secondary | ICD-10-CM | POA: Diagnosis not present

## 2019-09-05 DIAGNOSIS — H2512 Age-related nuclear cataract, left eye: Secondary | ICD-10-CM | POA: Diagnosis not present

## 2019-09-05 DIAGNOSIS — H2513 Age-related nuclear cataract, bilateral: Secondary | ICD-10-CM | POA: Diagnosis not present

## 2019-09-09 ENCOUNTER — Other Ambulatory Visit: Payer: Self-pay

## 2019-09-09 ENCOUNTER — Ambulatory Visit (INDEPENDENT_AMBULATORY_CARE_PROVIDER_SITE_OTHER): Payer: Medicare PPO | Admitting: Legal Medicine

## 2019-09-09 ENCOUNTER — Encounter: Payer: Self-pay | Admitting: Legal Medicine

## 2019-09-09 VITALS — BP 180/80 | HR 74 | Temp 97.7°F | Resp 16 | Ht 67.0 in | Wt 157.0 lb

## 2019-09-09 DIAGNOSIS — I10 Essential (primary) hypertension: Secondary | ICD-10-CM | POA: Diagnosis not present

## 2019-09-09 NOTE — Progress Notes (Signed)
Subjective:  Patient ID: [name] [name], female    DOB: [birth year]  Age: 74 y.o. MRN: [id number]  Chief Complaint  Patient presents with  . Hypertension    Patient is here for follow up after she stopped amlodipine and Diarrhea is stopped.    HPI: Chronic visit Patient presents for follow up of hypertension.  Patient tolerating amlodipine stopped well with side effects.  Patient was diagnosed with hypertension 2010 so has been treated for hypertension for 10 years.Patient is working on maintaining diet and exercise regimen and follows up as directed. Complication include none.   No current outpatient medications on file prior to visit.   No current facility-administered medications on file prior to visit.   Past Medical History:  Diagnosis Date  . Allergic rhinitis due to pollen   . Asthma   . CONSTIPATION   . Essential hypertension 09/21/2008   Qualifier: Diagnosis of  By: Kem Parkinson    . GERD   . HYPERTENSION   . MITRAL VALVE PROLAPSE   . Nonrheumatic mitral (valve) prolapse   . Palpitations   . PREMATURE ATRIAL CONTRACTIONS   . Solitary pulmonary nodule    Past Surgical History:  Procedure Laterality Date  . BACK SURGERY    . LAPAROSCOPIC HYSTERECTOMY      Family History  Problem Relation Age of Onset  . Heart disease Other   . Hypertension Mother   . Parkinson's disease Father    Social History   Socioeconomic History  . Marital status: Married    Spouse name: Not on file  . Number of children: Not on file  . Years of education: Not on file  . Highest education level: Not on file  Occupational History  . Not on file  Tobacco Use  . Smoking status: Never Smoker  . Smokeless tobacco: Never Used  Substance and Sexual Activity  . Alcohol use: No  . Drug use: Not on file  . Sexual activity: Not on file  Other Topics Concern  . Not on file  Social History Narrative  . Not on file   Social Determinants of Health   Financial Resource Strain:   .  Difficulty of Paying Living Expenses:   Food Insecurity:   . Worried About Programme researcher, broadcasting/film/video in the Last Year:   . Barista in the Last Year:   Transportation Needs:   . Freight forwarder (Medical):   Marland Kitchen Lack of Transportation (Non-Medical):   Physical Activity:   . Days of Exercise per Week:   . Minutes of Exercise per Session:   Stress:   . Feeling of Stress :   Social Connections:   . Frequency of Communication with Friends and Family:   . Frequency of Social Gatherings with Friends and Family:   . Attends Religious Services:   . Active Member of Clubs or Organizations:   . Attends Banker Meetings:   Marland Kitchen Marital Status:     Review of Systems  Constitutional: Negative.   HENT: Negative.   Eyes: Negative.   Respiratory: Negative.   Cardiovascular: Negative.   Gastrointestinal: Negative.   Endocrine: Negative.   Genitourinary: Negative.   Musculoskeletal: Negative.        Legs cramp  Neurological: Negative.   Psychiatric/Behavioral: Negative.      Objective:  BP (!) 180/80   Pulse 74   Temp 97.7 F (36.5 C) (Temporal)   Resp 16   Ht 5\' 7"  (1.702 m)  Wt 157 lb (71.2 kg)   BMI 24.59 kg/m   BP/Weight 09/09/2019 09/03/2019 08/26/2019  Systolic BP 180 162 140  Diastolic BP 80 90 78  Wt. (Lbs) 157 160 163.2  BMI 24.59 25.06 25.56    Physical Exam Vitals reviewed.  Constitutional:      Appearance: Normal appearance.  HENT:     Right Ear: Tympanic membrane normal.     Left Ear: Tympanic membrane normal.     Nose: Nose normal.  Eyes:     Extraocular Movements: Extraocular movements intact.     Conjunctiva/sclera: Conjunctivae normal.     Pupils: Pupils are equal, round, and reactive to light.  Cardiovascular:     Rate and Rhythm: Normal rate and regular rhythm.     Pulses: Normal pulses.     Heart sounds: Normal heart sounds.  Pulmonary:     Effort: Pulmonary effort is normal.  Abdominal:     General: Abdomen is flat. Bowel sounds  are normal.     Palpations: Abdomen is soft.  Musculoskeletal:        General: Normal range of motion.     Cervical back: Normal range of motion and neck supple.  Skin:    General: Skin is warm and dry.     Capillary Refill: Capillary refill takes less than 2 seconds.  Neurological:     General: No focal deficit present.     Mental Status: She is alert and oriented to person, place, and time.  Psychiatric:        Mood and Affect: Mood normal.        Behavior: Behavior normal.       Lab Results  Component Value Date   WBC 8.9 08/03/2019   HGB 13.6 08/03/2019   HCT 42.3 08/03/2019   PLT 260 08/03/2019   GLUCOSE 94 09/03/2019   CHOL 238 (H) 02/02/2016   TRIG 105 02/02/2016   HDL 64 02/02/2016   LDLCALC 153 (H) 02/02/2016   ALT 20 09/03/2019   AST 26 09/03/2019   NA 141 09/03/2019   K 4.5 09/03/2019   CL 104 09/03/2019   CREATININE 1.10 (H) 09/03/2019   BUN 21 09/03/2019   CO2 25 09/03/2019   TSH 1.290 02/02/2016   HGBA1C 5.5 02/02/2016      Assessment & Plan:   1. Essential hypertension Patient is intolerant of most antibiotics and BP remains borderline but she is now without diarrhea or other weakness symptoms.  She does not have known cardiac disease, so will will watch.        Follow-up: Return in about 6 months (around 03/11/2020) for fasting.  An After Visit Summary was printed and given to the patient .  Brent Bulla [facility] [phone]

## 2019-10-30 ENCOUNTER — Ambulatory Visit (INDEPENDENT_AMBULATORY_CARE_PROVIDER_SITE_OTHER): Payer: Medicare PPO

## 2019-10-30 ENCOUNTER — Other Ambulatory Visit: Payer: Self-pay

## 2019-10-30 DIAGNOSIS — Z23 Encounter for immunization: Secondary | ICD-10-CM | POA: Diagnosis not present

## 2019-10-30 NOTE — Progress Notes (Signed)
   Covid-19 Vaccination Clinic  Name:  [name] [name]    MRN: [id number] DOB: 06/19/1945  10/30/2019  [name] was observed post Covid-19 immunization for 15 minutes without incident. She was provided with Vaccine Information Sheet and instruction to access the V-Safe system.   [name] was instructed to call [phone] with any severe reactions post vaccine: Marland Kitchen Difficulty breathing  . Swelling of face and throat  . A fast heartbeat  . A bad rash all over body  . Dizziness and weakness

## 2019-11-20 ENCOUNTER — Encounter: Payer: Self-pay | Admitting: Legal Medicine

## 2019-11-20 ENCOUNTER — Ambulatory Visit (INDEPENDENT_AMBULATORY_CARE_PROVIDER_SITE_OTHER): Payer: Medicare PPO

## 2019-11-20 DIAGNOSIS — Z23 Encounter for immunization: Secondary | ICD-10-CM | POA: Diagnosis not present

## 2019-12-01 DIAGNOSIS — L821 Other seborrheic keratosis: Secondary | ICD-10-CM | POA: Diagnosis not present

## 2019-12-01 DIAGNOSIS — D485 Neoplasm of uncertain behavior of skin: Secondary | ICD-10-CM | POA: Diagnosis not present

## 2019-12-01 DIAGNOSIS — D2310 Other benign neoplasm of skin of unspecified eyelid, including canthus: Secondary | ICD-10-CM | POA: Diagnosis not present

## 2019-12-01 DIAGNOSIS — L905 Scar conditions and fibrosis of skin: Secondary | ICD-10-CM | POA: Diagnosis not present

## 2019-12-01 DIAGNOSIS — C44612 Basal cell carcinoma of skin of right upper limb, including shoulder: Secondary | ICD-10-CM | POA: Diagnosis not present

## 2019-12-01 DIAGNOSIS — Z85828 Personal history of other malignant neoplasm of skin: Secondary | ICD-10-CM | POA: Diagnosis not present

## 2019-12-01 DIAGNOSIS — D229 Melanocytic nevi, unspecified: Secondary | ICD-10-CM | POA: Diagnosis not present

## 2019-12-05 DIAGNOSIS — C44612 Basal cell carcinoma of skin of right upper limb, including shoulder: Secondary | ICD-10-CM | POA: Diagnosis not present

## 2019-12-30 ENCOUNTER — Ambulatory Visit (INDEPENDENT_AMBULATORY_CARE_PROVIDER_SITE_OTHER): Payer: Medicare PPO | Admitting: Legal Medicine

## 2019-12-30 ENCOUNTER — Other Ambulatory Visit: Payer: Self-pay

## 2019-12-30 ENCOUNTER — Encounter: Payer: Self-pay | Admitting: Legal Medicine

## 2019-12-30 VITALS — BP 156/100 | HR 85 | Temp 97.6°F | Ht 67.0 in | Wt 158.0 lb

## 2019-12-30 DIAGNOSIS — R300 Dysuria: Secondary | ICD-10-CM

## 2019-12-30 DIAGNOSIS — I10 Essential (primary) hypertension: Secondary | ICD-10-CM

## 2019-12-30 DIAGNOSIS — I70219 Atherosclerosis of native arteries of extremities with intermittent claudication, unspecified extremity: Secondary | ICD-10-CM | POA: Diagnosis not present

## 2019-12-30 DIAGNOSIS — E7800 Pure hypercholesterolemia, unspecified: Secondary | ICD-10-CM | POA: Diagnosis not present

## 2019-12-30 DIAGNOSIS — K2100 Gastro-esophageal reflux disease with esophagitis, without bleeding: Secondary | ICD-10-CM

## 2019-12-30 LAB — COMPREHENSIVE METABOLIC PANEL
ALT: 16 IU/L (ref 0–32)
AST: 26 IU/L (ref 0–40)
Albumin/Globulin Ratio: 1.7 (ref 1.2–2.2)
Albumin: 4.2 g/dL (ref 3.7–4.7)
Alkaline Phosphatase: 72 IU/L (ref 44–121)
BUN/Creatinine Ratio: 19 (ref 12–28)
BUN: 19 mg/dL (ref 8–27)
Bilirubin Total: 0.5 mg/dL (ref 0.0–1.2)
CO2: 22 mmol/L (ref 20–29)
Calcium: 9.6 mg/dL (ref 8.7–10.3)
Chloride: 103 mmol/L (ref 96–106)
Creatinine, Ser: 1 mg/dL (ref 0.57–1.00)
GFR calc Af Amer: 63 mL/min/{1.73_m2} (ref >59)
GFR calc non Af Amer: 55 mL/min/{1.73_m2} — ABNORMAL LOW (ref >59)
Globulin, Total: 2.5 g/dL (ref 1.5–4.5)
Glucose: 104 mg/dL — ABNORMAL HIGH (ref 65–99)
Potassium: 4.1 mmol/L (ref 3.5–5.2)
Sodium: 142 mmol/L (ref 134–144)
Total Protein: 6.7 g/dL (ref 6.0–8.5)

## 2019-12-30 LAB — CBC WITH DIFFERENTIAL/PLATELET
Basophils Absolute: 0 10*3/uL (ref 0.0–0.2)
Basos: 0 %
EOS (ABSOLUTE): 0 10*3/uL (ref 0.0–0.4)
Eos: 0 %
Hematocrit: 39.7 % (ref 34.0–46.6)
Hemoglobin: 13.5 g/dL (ref 11.1–15.9)
Immature Grans (Abs): 0 10*3/uL (ref 0.0–0.1)
Immature Granulocytes: 0 %
Lymphocytes Absolute: 1.3 10*3/uL (ref 0.7–3.1)
Lymphs: 13 %
MCH: 31 pg (ref 26.6–33.0)
MCHC: 34 g/dL (ref 31.5–35.7)
MCV: 91 fL (ref 79–97)
Monocytes Absolute: 0.5 10*3/uL (ref 0.1–0.9)
Monocytes: 5 %
Neutrophils Absolute: 7.7 10*3/uL — ABNORMAL HIGH (ref 1.4–7.0)
Neutrophils: 82 %
Platelets: 253 10*3/uL (ref 150–450)
RBC: 4.35 x10E6/uL (ref 3.77–5.28)
RDW: 11.8 % (ref 11.7–15.4)
WBC: 9.6 10*3/uL (ref 3.4–10.8)

## 2019-12-30 LAB — POCT URINALYSIS DIPSTICK
Bilirubin, UA: NEGATIVE
Glucose, UA: NEGATIVE
Leukocytes, UA: NEGATIVE
Nitrite, UA: NEGATIVE
Protein, UA: NEGATIVE
Spec Grav, UA: 1.02 (ref 1.010–1.025)
Urobilinogen, UA: 0.2 E.U./dL
pH, UA: 6 (ref 5.0–8.0)

## 2019-12-30 MED ORDER — METOPROLOL TARTRATE 25 MG PO TABS: 25.0000 mg | ORAL_TABLET | ORAL | 3 refills | Status: DC

## 2019-12-30 NOTE — Progress Notes (Signed)
Acute Office Visit  Subjective:    Patient ID: [name] [name], female    DOB: [birth year], 74 y.o.   MRN: [id number]  Chief Complaint  Patient presents with  . Hypertension  . Urinary Tract Infection    HPI Patient is in today for feeling bad.  She is feeling poorly for one week, BP high at home.  No energy and not eating well. She is having polyuria , no dysuria, no frequency. No headaches no blurred vision.  Past Medical History:  Diagnosis Date  . Allergic rhinitis due to pollen   . Asthma   . CONSTIPATION   . Essential hypertension 09/21/2008   Qualifier: Diagnosis of  By: Kem Parkinson    . GERD   . HYPERTENSION   . MITRAL VALVE PROLAPSE   . Nonrheumatic mitral (valve) prolapse   . Palpitations   . PREMATURE ATRIAL CONTRACTIONS   . Solitary pulmonary nodule     Past Surgical History:  Procedure Laterality Date  . BACK SURGERY    . LAPAROSCOPIC HYSTERECTOMY      Family History  Problem Relation Age of Onset  . Heart disease Other   . Hypertension Mother   . Parkinson's disease Father     Social History   Socioeconomic History  . Marital status: Married    Spouse name: Not on file  . Number of children: Not on file  . Years of education: Not on file  . Highest education level: Not on file  Occupational History  . Not on file  Tobacco Use  . Smoking status: Never Smoker  . Smokeless tobacco: Never Used  Substance and Sexual Activity  . Alcohol use: No  . Drug use: Not on file  . Sexual activity: Not on file  Other Topics Concern  . Not on file  Social History Narrative  . Not on file   Social Determinants of Health   Financial Resource Strain:   . Difficulty of Paying Living Expenses: Not on file  Food Insecurity:   . Worried About Programme researcher, broadcasting/film/video in the Last Year: Not on file  . Ran Out of Food in the Last Year: Not on file  Transportation Needs:   . Lack of Transportation (Medical): Not on file  . Lack of Transportation  (Non-Medical): Not on file  Physical Activity:   . Days of Exercise per Week: Not on file  . Minutes of Exercise per Session: Not on file  Stress:   . Feeling of Stress : Not on file  Social Connections:   . Frequency of Communication with Friends and Family: Not on file  . Frequency of Social Gatherings with Friends and Family: Not on file  . Attends Religious Services: Not on file  . Active Member of Clubs or Organizations: Not on file  . Attends Banker Meetings: Not on file  . Marital Status: Not on file  Intimate Partner Violence:   . Fear of Current or Ex-Partner: Not on file  . Emotionally Abused: Not on file  . Physically Abused: Not on file  . Sexually Abused: Not on file    No outpatient medications prior to visit.   No facility-administered medications prior to visit.    Allergies  Allergen Reactions  . Clindamycin   . Levaquin [Levofloxacin] Itching  . Lisinopril Hives  . Erythromycin Swelling and Rash    Review of Systems  Constitutional: Positive for fatigue. Negative for chills and fever.  HENT: Negative for  congestion, ear pain, rhinorrhea and sore throat.   Respiratory: Negative for cough and shortness of breath.   Cardiovascular: Negative for chest pain, palpitations and leg swelling.  Gastrointestinal: Negative for abdominal pain, constipation, diarrhea, nausea and vomiting.  Genitourinary: Positive for frequency and urgency. Negative for dysuria.  Musculoskeletal: Negative for back pain and myalgias.  Neurological: Positive for headaches. Negative for dizziness, weakness and light-headedness.  Psychiatric/Behavioral: Negative for dysphoric mood. The patient is not nervous/anxious.   reviewed     Objective:    Physical Exam Vitals reviewed.  Constitutional:      Appearance: Normal appearance. She is normal weight.  HENT:     Head: Normocephalic.     Right Ear: Tympanic membrane, ear canal and external ear normal.     Left Ear:  Tympanic membrane, ear canal and external ear normal.     Mouth/Throat:     Mouth: Mucous membranes are dry.     Pharynx: Oropharynx is clear.  Eyes:     Extraocular Movements: Extraocular movements intact.     Conjunctiva/sclera: Conjunctivae normal.     Pupils: Pupils are equal, round, and reactive to light.  Cardiovascular:     Rate and Rhythm: Normal rate and regular rhythm.     Pulses: Normal pulses.     Heart sounds: Normal heart sounds. No murmur heard.  No gallop.   Pulmonary:     Effort: Pulmonary effort is normal.     Breath sounds: Normal breath sounds. No wheezing or rhonchi.  Abdominal:     General: Abdomen is flat. Bowel sounds are normal.     Palpations: Abdomen is soft.  Musculoskeletal:        General: Normal range of motion.     Cervical back: Normal range of motion and neck supple.  Skin:    General: Skin is warm.     Capillary Refill: Capillary refill takes less than 2 seconds.  Neurological:     General: No focal deficit present.     Mental Status: She is alert and oriented to person, place, and time.     Deep Tendon Reflexes: Reflexes normal.  Psychiatric:        Mood and Affect: Mood normal.        Thought Content: Thought content normal.     BP (!) 156/100   Pulse 85   Temp 97.6 F (36.4 C)   Ht 5\' 7"  (1.702 m)   Wt 158 lb (71.7 kg)   SpO2 95%   BMI 24.75 kg/m  Wt Readings from Last 3 Encounters:  12/30/19 158 lb (71.7 kg)  09/09/19 157 lb (71.2 kg)  09/03/19 160 lb (72.6 kg)    Health Maintenance Due  Topic Date Due  . Hepatitis C Screening  Never done  . MAMMOGRAM  Never done  . TETANUS/TDAP  Never done  . DEXA SCAN  Never done  . PNA vac Low Risk Adult (2 of 2 - PPSV23) 01/19/2016    There are no preventive care reminders to display for this patient.   Lab Results  Component Value Date   TSH 1.290 02/02/2016   Lab Results  Component Value Date   WBC 8.9 08/03/2019   HGB 13.6 08/03/2019   HCT 42.3 08/03/2019   MCV 92.2  08/03/2019   PLT 260 08/03/2019   Lab Results  Component Value Date   NA 141 09/03/2019   K 4.5 09/03/2019   CO2 25 09/03/2019   GLUCOSE 94 09/03/2019   BUN  21 09/03/2019   CREATININE 1.10 (H) 09/03/2019   BILITOT 0.3 09/03/2019   ALKPHOS 72 09/03/2019   AST 26 09/03/2019   ALT 20 09/03/2019   PROT 6.7 09/03/2019   ALBUMIN 4.3 09/03/2019   CALCIUM 10.0 09/03/2019   ANIONGAP 10 08/03/2019   Lab Results  Component Value Date   CHOL 238 (H) 02/02/2016   Lab Results  Component Value Date   HDL 64 02/02/2016   Lab Results  Component Value Date   LDLCALC 153 (H) 02/02/2016   Lab Results  Component Value Date   TRIG 105 02/02/2016   Lab Results  Component Value Date   CHOLHDL 3.7 02/02/2016   Lab Results  Component Value Date   HGBA1C 5.5 02/02/2016       Assessment & Plan:   Diagnoses and all orders for this visit: Essential hypertension -     CBC with Differential/Platelet -     Comprehensive metabolic panel -     metoprolol tartrate (LOPRESSOR) 25 MG tablet; Take 1 tablet (25 mg total) by mouth 1 day or 1 dose. An individual hypertension care plan was established and reinforced today.  The patient's status was assessed using clinical findings on exam and labs or diagnostic tests. The patient's success at meeting treatment goals on disease specific evidence-based guidelines and found to be fair controlled. SELF MANAGEMENT: The patient and I together assessed ways to personally work towards obtaining the recommended goals. RECOMMENDATIONS: avoid decongestants found in common cold remedies, decrease consumption of alcohol, perform routine monitoring of BP with home BP cuff, exercise, reduction of dietary salt, take medicines as prescribed, try not to miss doses and quit smoking.  Regular exercise and maintaining a healthy weight is needed.  Stress reduction may help. A CLINICAL SUMMARY including written plan identify barriers to care unique to individual due to social  or financial issues.  We attempt to mutually creat solutions for individual and family understanding.  Gastroesophageal reflux disease with esophagitis without hemorrhage Plan of care was formulated today.  She is doing well.  A plan of care was formulated using patient exam, tests and other sources to optimize care using evidence based information.  Recommend no smoking, no eating after supper, avoid fatty foods, elevate Head of bed, avoid tight fitting clothing.  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  Needs ABI  Hypercholesterolemia -     Lipid panel Test for cholesterol today. Put patient on DASH diet.       Brent Bulla, MD

## 2019-12-31 LAB — LIPID PANEL
Chol/HDL Ratio: 4 ratio (ref 0.0–4.4)
Cholesterol, Total: 255 mg/dL — ABNORMAL HIGH (ref 100–199)
HDL: 64 mg/dL (ref >39)
LDL Chol Calc (NIH): 176 mg/dL — ABNORMAL HIGH (ref 0–99)
Triglycerides: 90 mg/dL (ref 0–149)
VLDL Cholesterol Cal: 15 mg/dL (ref 5–40)

## 2019-12-31 LAB — CARDIOVASCULAR RISK ASSESSMENT

## 2019-12-31 NOTE — Progress Notes (Signed)
LDL cholesterol 176 very high need to consider a statin to lower CV risk, cbc normal, kidney tests stage 3 stable, liver tests normal,  lp

## 2020-01-01 ENCOUNTER — Other Ambulatory Visit: Payer: Self-pay

## 2020-01-01 ENCOUNTER — Encounter (HOSPITAL_COMMUNITY): Payer: Self-pay

## 2020-01-01 ENCOUNTER — Ambulatory Visit (HOSPITAL_COMMUNITY)
Admission: EM | Admit: 2020-01-01 | Discharge: 2020-01-01 | Disposition: A | Discharge status: Home / Self Care | Payer: Medicare PPO | Attending: Family Medicine | Admitting: Family Medicine

## 2020-01-01 DIAGNOSIS — I10 Essential (primary) hypertension: Secondary | ICD-10-CM

## 2020-01-01 DIAGNOSIS — R6889 Other general symptoms and signs: Secondary | ICD-10-CM

## 2020-01-01 NOTE — ED Triage Notes (Signed)
Pt c/o hypertension X 3 days. She states she has had a lack of appetite and has been experiencing earaches. Pt states she took her BP medication yesterday around 1500 and today. Pt states when she takes Metoprolol, she feels tightness in her chest.

## 2020-01-06 NOTE — ED Provider Notes (Signed)
[facility]   [id number] 01/01/20 Arrival Time: 1349  ASSESSMENT & PLAN:  1. Essential hypertension   2. Feeling unwell     BP elevated here. See below. I am not going to adjust her HTN medications. Just started on metoprolol. Will continue taking and call her PCP from prompt f/u. No symptoms to suggest hypertensive urgency. No indication for hospital eval at this time. Discussed.  BP Readings from Last 3 Encounters:  01/01/20 (!) 194/92  12/30/19 (!) 156/100  09/09/19 (!) 180/80      Follow-up Information    Abigail Miyamoto, MD.   Specialty: Family Medicine Why: As needed. Contact information: [address] [place] [address] [phone]               Reviewed expectations re: course of current medical issues. Questions answered. Outlined signs and symptoms indicating need for more acute intervention. Patient verbalized understanding. After Visit Summary given.   SUBJECTIVE:  [name] [name] is a 74 y.o. female who presents with concerns regarding increased blood pressures. Sje reports that she is treated for HTN and was just placed on metoprolol a few d ago. She reports taking medications as instructed, no chest pain on exertion, no dyspnea on exertion, no swelling of ankles, no orthostatic dizziness or lightheadedness, no orthopnea or paroxysmal nocturnal dyspnea and no palpitations.  Denies symptoms of chest pain, palpations, orthopnea, nocturnal dyspnea, or LE edema.  Social History   Tobacco Use  Smoking Status Never Smoker  Smokeless Tobacco Never Used     OBJECTIVE:  Vitals:   01/01/20 1525  BP: (!) 194/92  Pulse: 71  Resp: 16  Temp: 98.5 F (36.9 C)  TempSrc: Oral  SpO2: 99%    General appearance: alert; no distress Eyes: PERRLA; EOMI HENT: normocephalic; atraumatic Neck: supple Lungs: unlabored Heart: regular rate and rhythm Extremities: no edema; symmetrical with no gross deformities Skin: warm and  dry Psychological: alert and cooperative; normal mood and affect   Labs Reviewed: Results for orders placed or performed in visit on 12/30/19  CBC with Differential/Platelet  Result Value Ref Range   WBC 9.6 3.4 - 10.8 x10E3/uL   RBC 4.35 3.77 - 5.28 x10E6/uL   Hemoglobin 13.5 11.1 - 15.9 g/dL   Hematocrit 89.3 81.0 - 46.6 %   MCV 91 79 - 97 fL   MCH 31.0 26.6 - 33.0 pg   MCHC 34.0 31.5 - 35.7 g/dL   RDW 17.5 10.2 - 58.5 %   Platelets 253 150 - 450 x10E3/uL   Neutrophils 82 Not Estab. %   Lymphs 13 Not Estab. %   Monocytes 5 Not Estab. %   Eos 0 Not Estab. %   Basos 0 Not Estab. %   Neutrophils Absolute 7.7 (H) 1.4 - 7.0 x10E3/uL   Lymphocytes Absolute 1.3 0.7 - 3.1 x10E3/uL   Monocytes Absolute 0.5 0.1 - 0.9 x10E3/uL   EOS (ABSOLUTE) 0.0 0.0 - 0.4 x10E3/uL   Basophils Absolute 0.0 0.0 - 0.2 x10E3/uL   Immature Granulocytes 0 Not Estab. %   Immature Grans (Abs) 0.0 0.0 - 0.1 x10E3/uL  Comprehensive metabolic panel  Result Value Ref Range   Glucose 104 (H) 65 - 99 mg/dL   BUN 19 8 - 27 mg/dL   Creatinine, Ser 2.77 0.57 - 1.00 mg/dL   GFR calc non Af Amer 55 (L) >59 mL/min/1.73   GFR calc Af Amer 63 >59 mL/min/1.73   BUN/Creatinine Ratio 19 12 - 28  Sodium 142 134 - 144 mmol/L   Potassium 4.1 3.5 - 5.2 mmol/L   Chloride 103 96 - 106 mmol/L   CO2 22 20 - 29 mmol/L   Calcium 9.6 8.7 - 10.3 mg/dL   Total Protein 6.7 6.0 - 8.5 g/dL   Albumin 4.2 3.7 - 4.7 g/dL   Globulin, Total 2.5 1.5 - 4.5 g/dL   Albumin/Globulin Ratio 1.7 1.2 - 2.2   Bilirubin Total 0.5 0.0 - 1.2 mg/dL   Alkaline Phosphatase 72 44 - 121 IU/L   AST 26 0 - 40 IU/L   ALT 16 0 - 32 IU/L  Lipid panel  Result Value Ref Range   Cholesterol, Total 255 (H) 100 - 199 mg/dL   Triglycerides 90 0 - 149 mg/dL   HDL 64 >59 mg/dL   VLDL Cholesterol Cal 15 5 - 40 mg/dL   LDL Chol Calc (NIH) 935 (H) 0 - 99 mg/dL   Chol/HDL Ratio 4.0 0.0 - 4.4 ratio  Cardiovascular Risk Assessment  Result Value Ref Range    Interpretation Note   POCT urinalysis dipstick  Result Value Ref Range   Color, UA     Clarity, UA     Glucose, UA Negative Negative   Bilirubin, UA neg    Ketones, UA postive    Spec Grav, UA 1.020 1.010 - 1.025   Blood, UA trace    pH, UA 6.0 5.0 - 8.0   Protein, UA Negative Negative   Urobilinogen, UA 0.2 0.2 or 1.0 E.U./dL   Nitrite, UA neg    Leukocytes, UA Negative Negative   Appearance     Odor       Allergies  Allergen Reactions  . Clindamycin   . Levaquin [Levofloxacin] Itching  . Lisinopril Hives  . Erythromycin Swelling and Rash    Past Medical History:  Diagnosis Date  . Allergic rhinitis due to pollen   . CONSTIPATION   . Essential hypertension 09/21/2008   Qualifier: Diagnosis of  By: Kem Parkinson    . GERD   . MITRAL VALVE PROLAPSE   . Nonrheumatic mitral (valve) prolapse   . PREMATURE ATRIAL CONTRACTIONS   . Solitary pulmonary nodule    Social History   Socioeconomic History  . Marital status: Married    Spouse name: Not on file  . Number of children: Not on file  . Years of education: Not on file  . Highest education level: Not on file  Occupational History  . Not on file  Tobacco Use  . Smoking status: Never Smoker  . Smokeless tobacco: Never Used  Substance and Sexual Activity  . Alcohol use: No  . Drug use: Not on file  . Sexual activity: Not on file  Other Topics Concern  . Not on file  Social History Narrative  . Not on file   Social Determinants of Health   Financial Resource Strain: Not on file  Food Insecurity: Not on file  Transportation Needs: Not on file  Physical Activity: Not on file  Stress: Not on file  Social Connections: Not on file  Intimate Partner Violence: Not on file   Family History  Problem Relation Age of Onset  . Heart disease Other   . Hypertension Mother   . Parkinson's disease Father    Past Surgical History:  Procedure Laterality Date  . BACK SURGERY    . LAPAROSCOPIC HYSTERECTOMY         Mardella Layman, MD 01/06/20 [phone]

## 2020-01-09 ENCOUNTER — Ambulatory Visit: Payer: Medicare PPO

## 2020-03-11 ENCOUNTER — Other Ambulatory Visit: Payer: Self-pay

## 2020-03-11 ENCOUNTER — Ambulatory Visit: Payer: Medicare PPO | Admitting: Legal Medicine

## 2020-03-11 ENCOUNTER — Encounter: Payer: Self-pay | Admitting: Legal Medicine

## 2020-03-11 VITALS — BP 138/88 | HR 97 | Temp 97.1°F | Ht 67.0 in | Wt 157.0 lb

## 2020-03-11 DIAGNOSIS — J4520 Mild intermittent asthma, uncomplicated: Secondary | ICD-10-CM

## 2020-03-11 DIAGNOSIS — I10 Essential (primary) hypertension: Secondary | ICD-10-CM

## 2020-03-11 DIAGNOSIS — K2100 Gastro-esophageal reflux disease with esophagitis, without bleeding: Secondary | ICD-10-CM | POA: Diagnosis not present

## 2020-03-11 DIAGNOSIS — I70219 Atherosclerosis of native arteries of extremities with intermittent claudication, unspecified extremity: Secondary | ICD-10-CM | POA: Diagnosis not present

## 2020-03-11 LAB — PULMONARY FUNCTION TEST
FEV1/FVC: 62.9 %
FEV1: 1.58 L
FVC: 2.08 L

## 2020-03-11 MED ORDER — ALBUTEROL SULFATE HFA 108 (90 BASE) MCG/ACT IN AERS: 2.0000 | INHALATION_SPRAY | Freq: Four times a day (QID) | RESPIRATORY_TRACT | 6 refills | Status: DC | PRN

## 2020-03-11 NOTE — Progress Notes (Signed)
Subjective:  Patient ID: [name] [name], female    DOB: [birth year]  Age: 75 y.o. MRN: [id number]  Chief Complaint  Patient presents with  . Hypertension    Patient has not been taking metoprolol due to her b/p being normal however she is going to start.    HPI: chronic visit Patient presents for follow up of hypertension.  Patient tolerating metoprolol well with side effects.  Patient was diagnosed with hypertension 2010 so has been treated for hypertension for 10 years.Patient is working on maintaining diet and exercise regimen and follows up as directed. Complication include none.   Less cramping in legs, drinking more water.no further claudication. Current Outpatient Medications on File Prior to Visit  Medication Sig Dispense Refill  . metoprolol tartrate (LOPRESSOR) 25 MG tablet Take 1 tablet (25 mg total) by mouth 1 day or 1 dose. 30 tablet 3   No current facility-administered medications on file prior to visit.   Past Medical History:  Diagnosis Date  . Allergic rhinitis due to pollen   . CONSTIPATION   . Essential hypertension 09/21/2008   Qualifier: Diagnosis of  By: Kem Parkinson    . GERD   . MITRAL VALVE PROLAPSE   . Nonrheumatic mitral (valve) prolapse   . PREMATURE ATRIAL CONTRACTIONS   . Solitary pulmonary nodule    Past Surgical History:  Procedure Laterality Date  . BACK SURGERY    . LAPAROSCOPIC HYSTERECTOMY      Family History  Problem Relation Age of Onset  . Heart disease Other   . Hypertension Mother   . Parkinson's disease Father    Social History   Socioeconomic History  . Marital status: Married    Spouse name: Not on file  . Number of children: Not on file  . Years of education: Not on file  . Highest education level: Not on file  Occupational History  . Not on file  Tobacco Use  . Smoking status: Never Smoker  . Smokeless tobacco: Never Used  Substance and Sexual Activity  . Alcohol use: No  . Drug use: Not on file  . Sexual  activity: Not on file  Other Topics Concern  . Not on file  Social History Narrative  . Not on file   Social Determinants of Health   Financial Resource Strain: Not on file  Food Insecurity: Not on file  Transportation Needs: Not on file  Physical Activity: Not on file  Stress: Not on file  Social Connections: Not on file    Review of Systems  Constitutional: Negative for activity change, appetite change and unexpected weight change.  HENT: Negative.   Eyes: Negative for visual disturbance.  Respiratory: Negative for chest tightness and shortness of breath.   Cardiovascular: Negative for chest pain, palpitations and leg swelling.  Gastrointestinal: Positive for constipation. Negative for abdominal distention and abdominal pain.  Endocrine: Negative.   Genitourinary: Negative for difficulty urinating, dysuria and urgency.  Musculoskeletal: Negative for arthralgias and back pain.  Skin: Negative.   Neurological: Negative.   Psychiatric/Behavioral: Negative.      Objective:  BP 138/88   Pulse 97   Temp (!) 97.1 F (36.2 C)   Ht 5\' 7"  (1.702 m)   Wt 157 lb (71.2 kg)   SpO2 97%   BMI 24.59 kg/m   BP/Weight 03/11/2020 01/01/2020 12/30/2019  Systolic BP 138 194 156  Diastolic BP 88 92 100  Wt. (Lbs) 157 - 158  BMI 24.59 - 24.75  Physical Exam Vitals reviewed.  Constitutional:      General: She is not in acute distress.    Appearance: Normal appearance.  HENT:     Right Ear: Tympanic membrane normal.     Left Ear: Tympanic membrane normal.     Mouth/Throat:     Mouth: Mucous membranes are moist.     Pharynx: Oropharynx is clear.  Eyes:     Extraocular Movements: Extraocular movements intact.     Conjunctiva/sclera: Conjunctivae normal.     Pupils: Pupils are equal, round, and reactive to light.  Cardiovascular:     Rate and Rhythm: Normal rate and regular rhythm.     Pulses: Normal pulses.     Heart sounds: Normal heart sounds. No murmur heard. No gallop.    Pulmonary:     Effort: Pulmonary effort is normal. No respiratory distress.     Breath sounds: Normal breath sounds. No rales.  Abdominal:     General: Abdomen is flat. Bowel sounds are normal. There is no distension.     Palpations: Abdomen is soft.     Tenderness: There is no abdominal tenderness.  Musculoskeletal:        General: Normal range of motion.     Cervical back: Normal range of motion and neck supple.  Skin:    General: Skin is warm and dry.     Capillary Refill: Capillary refill takes less than 2 seconds.  Neurological:     General: No focal deficit present.     Mental Status: She is alert and oriented to person, place, and time. Mental status is at baseline.  Psychiatric:        Mood and Affect: Mood normal.        Behavior: Behavior normal.        Thought Content: Thought content normal.        Judgment: Judgment normal.       Lab Results  Component Value Date   WBC 9.6 12/30/2019   HGB 13.5 12/30/2019   HCT 39.7 12/30/2019   PLT 253 12/30/2019   GLUCOSE 104 (H) 12/30/2019   CHOL 255 (H) 12/30/2019   TRIG 90 12/30/2019   HDL 64 12/30/2019   LDLCALC 176 (H) 12/30/2019   ALT 16 12/30/2019   AST 26 12/30/2019   NA 142 12/30/2019   K 4.1 12/30/2019   CL 103 12/30/2019   CREATININE 1.00 12/30/2019   BUN 19 12/30/2019   CO2 22 12/30/2019   TSH 1.290 02/02/2016   HGBA1C 5.5 02/02/2016   PFT: FVC 103%, FEV1 99%, FEV1/FVC 96%   Assessment & Plan:  Diagnoses and all orders for this visit: Essential hypertension -     CBC with Differential/Platelet -     Comprehensive metabolic panel An individual hypertension care plan was established and reinforced today.  The patient's status was assessed using clinical findings on exam and labs or diagnostic tests. The patient's success at meeting treatment goals on disease specific evidence-based guidelines and found to be well controlled. SELF MANAGEMENT: The patient and I together assessed ways to personally work  towards obtaining the recommended goals. RECOMMENDATIONS: avoid decongestants found in common cold remedies, decrease consumption of alcohol, perform routine monitoring of BP with home BP cuff, exercise, reduction of dietary salt, take medicines as prescribed, try not to miss doses and quit smoking.  Regular exercise and maintaining a healthy weight is needed.  Stress reduction may help. A CLINICAL SUMMARY including written plan identify barriers to care unique  to individual due to social or financial issues.  We attempt to mutually creat solutions for individual and family understanding.  Mild intermittent asthma, uncomplicated -     Pulmonary Function Test -     albuterol (VENTOLIN HFA) 108 (90 Base) MCG/ACT inhaler; Inhale 2 puffs into the lungs every 6 (six) hours as needed for wheezing or shortness of breath. This patient has asthma mild and is on albuterol.  Patient is not having a flair.  Chronic medicines include albuterol. Addition new medicines none.  Asthma action plan is in place.   Gastroesophageal reflux disease with esophagitis without hemorrhage Plan of care was formulated today.  She is doing well.  A plan of care was formulated using patient exam, tests and other sources to optimize care using evidence based information.  Recommend no smoking, no eating after supper, avoid fatty foods, elevate Head of bed, avoid tight fitting clothing.  Continue on OTC . Extremity atherosclerosis with intermittent claudication (HCC) -     Lipid panel Patient is not having claudication.  She is active and walking and hydrating more.         I spent 25 minutes dedicated to the care of this patient on the date of this encounter to include face-to-face time with the patient, as well as:   Follow-up: Return in about 6 months (around 09/08/2020).  An After Visit Summary was printed and given to the patient.  Brent Bulla, MD [facility] [phone]

## 2020-03-12 LAB — COMPREHENSIVE METABOLIC PANEL
ALT: 15 IU/L (ref 0–32)
AST: 23 IU/L (ref 0–40)
Albumin/Globulin Ratio: 1.8 (ref 1.2–2.2)
Albumin: 4.4 g/dL (ref 3.7–4.7)
Alkaline Phosphatase: 67 IU/L (ref 44–121)
BUN/Creatinine Ratio: 20 (ref 12–28)
BUN: 21 mg/dL (ref 8–27)
Bilirubin Total: 0.5 mg/dL (ref 0.0–1.2)
CO2: 21 mmol/L (ref 20–29)
Calcium: 9.7 mg/dL (ref 8.7–10.3)
Chloride: 105 mmol/L (ref 96–106)
Creatinine, Ser: 1.04 mg/dL — ABNORMAL HIGH (ref 0.57–1.00)
GFR calc Af Amer: 60 mL/min/{1.73_m2} (ref >59)
GFR calc non Af Amer: 52 mL/min/{1.73_m2} — ABNORMAL LOW (ref >59)
Globulin, Total: 2.4 g/dL (ref 1.5–4.5)
Glucose: 93 mg/dL (ref 65–99)
Potassium: 4.4 mmol/L (ref 3.5–5.2)
Sodium: 142 mmol/L (ref 134–144)
Total Protein: 6.8 g/dL (ref 6.0–8.5)

## 2020-03-12 LAB — CBC WITH DIFFERENTIAL/PLATELET
Basophils Absolute: 0.1 10*3/uL (ref 0.0–0.2)
Basos: 1 %
EOS (ABSOLUTE): 0.1 10*3/uL (ref 0.0–0.4)
Eos: 2 %
Hematocrit: 42.1 % (ref 34.0–46.6)
Hemoglobin: 13.7 g/dL (ref 11.1–15.9)
Immature Grans (Abs): 0 10*3/uL (ref 0.0–0.1)
Immature Granulocytes: 1 %
Lymphocytes Absolute: 1.8 10*3/uL (ref 0.7–3.1)
Lymphs: 23 %
MCH: 30 pg (ref 26.6–33.0)
MCHC: 32.5 g/dL (ref 31.5–35.7)
MCV: 92 fL (ref 79–97)
Monocytes Absolute: 0.6 10*3/uL (ref 0.1–0.9)
Monocytes: 7 %
Neutrophils Absolute: 5.2 10*3/uL (ref 1.4–7.0)
Neutrophils: 66 %
Platelets: 250 10*3/uL (ref 150–450)
RBC: 4.57 x10E6/uL (ref 3.77–5.28)
RDW: 12 % (ref 11.7–15.4)
WBC: 7.7 10*3/uL (ref 3.4–10.8)

## 2020-03-12 LAB — LIPID PANEL
Chol/HDL Ratio: 4 ratio (ref 0.0–4.4)
Cholesterol, Total: 249 mg/dL — ABNORMAL HIGH (ref 100–199)
HDL: 63 mg/dL (ref >39)
LDL Chol Calc (NIH): 168 mg/dL — ABNORMAL HIGH (ref 0–99)
Triglycerides: 103 mg/dL (ref 0–149)
VLDL Cholesterol Cal: 18 mg/dL (ref 5–40)

## 2020-03-12 LAB — CARDIOVASCULAR RISK ASSESSMENT

## 2020-03-12 NOTE — Progress Notes (Signed)
Cbc normal, kidney tests still stage 3a, liver tests normal, LDL cholesterol is high at 168- recommend statin lp

## 2020-05-03 DIAGNOSIS — L905 Scar conditions and fibrosis of skin: Secondary | ICD-10-CM | POA: Diagnosis not present

## 2020-05-03 DIAGNOSIS — L820 Inflamed seborrheic keratosis: Secondary | ICD-10-CM | POA: Diagnosis not present

## 2020-05-03 DIAGNOSIS — Z85828 Personal history of other malignant neoplasm of skin: Secondary | ICD-10-CM | POA: Diagnosis not present

## 2020-05-13 DIAGNOSIS — Z1272 Encounter for screening for malignant neoplasm of vagina: Secondary | ICD-10-CM | POA: Diagnosis not present

## 2020-05-13 DIAGNOSIS — Z1231 Encounter for screening mammogram for malignant neoplasm of breast: Secondary | ICD-10-CM | POA: Diagnosis not present

## 2020-05-13 DIAGNOSIS — M8588 Other specified disorders of bone density and structure, other site: Secondary | ICD-10-CM | POA: Diagnosis not present

## 2020-05-13 DIAGNOSIS — N958 Other specified menopausal and perimenopausal disorders: Secondary | ICD-10-CM | POA: Diagnosis not present

## 2020-05-13 DIAGNOSIS — Z124 Encounter for screening for malignant neoplasm of cervix: Secondary | ICD-10-CM | POA: Diagnosis not present

## 2020-05-13 DIAGNOSIS — Z6824 Body mass index (BMI) 24.0-24.9, adult: Secondary | ICD-10-CM | POA: Diagnosis not present

## 2020-05-13 LAB — HM DEXA SCAN

## 2020-05-13 LAB — HM MAMMOGRAPHY

## 2020-06-01 ENCOUNTER — Encounter: Payer: Self-pay | Admitting: Legal Medicine

## 2020-06-17 ENCOUNTER — Telehealth: Payer: Medicare PPO | Admitting: Legal Medicine

## 2020-06-25 DIAGNOSIS — J209 Acute bronchitis, unspecified: Secondary | ICD-10-CM | POA: Diagnosis not present

## 2020-06-25 DIAGNOSIS — R0981 Nasal congestion: Secondary | ICD-10-CM | POA: Diagnosis not present

## 2020-06-25 DIAGNOSIS — J45909 Unspecified asthma, uncomplicated: Secondary | ICD-10-CM | POA: Diagnosis not present

## 2020-08-23 ENCOUNTER — Other Ambulatory Visit: Payer: Self-pay

## 2020-08-23 ENCOUNTER — Encounter: Payer: Self-pay | Admitting: Cardiovascular Disease

## 2020-08-23 ENCOUNTER — Ambulatory Visit: Payer: Medicare PPO | Admitting: Cardiovascular Disease

## 2020-08-23 VITALS — BP 164/90 | HR 98 | Ht 67.0 in | Wt 159.0 lb

## 2020-08-23 DIAGNOSIS — E782 Mixed hyperlipidemia: Secondary | ICD-10-CM | POA: Diagnosis not present

## 2020-08-23 DIAGNOSIS — I10 Essential (primary) hypertension: Secondary | ICD-10-CM | POA: Diagnosis not present

## 2020-08-23 MED ORDER — CARVEDILOL 3.125 MG PO TABS: 3.1250 mg | ORAL_TABLET | Freq: Two times a day (BID) | ORAL | 3 refills | Status: DC

## 2020-08-23 NOTE — Progress Notes (Signed)
Cardiology Office Note:    Date:  08/23/2020   ID:  [name] [name], DOB 1945-03-31, MRN [id number]  PCP:  Abigail Miyamoto, MD   [facility] Providers Cardiologist:  None     Referring MD: Abigail Miyamoto,*   Chief Complaint  Patient presents with   Hypertension    History of Present Illness:    [name] [name] is a 75 y.o. female with a hx of hypertension, mitral valve prolapse, and palpitations, presenting for follow-up evaluation.  The patient has been followed by Dr. Eden Emms in the past, last seen in 2019.  She brings in home blood pressure readings with readings ranging from the 130s to 160s over 60s and 70s.  She has had no recent symptoms of heart palpitations, chest pain, chest pressure, or shortness of breath.  She denies leg swelling, orthopnea, or PND.  She has had no lightheadedness or syncope.  She has a lot of issues with anxiety around her blood pressure.  She has whitecoat hypertension and gets very stressed out about coming to see a doctor.  I know the patient from caring for multiple family members.  She is here with her husband today.  She has had a great deal of difficulty tolerating antihypertensive medicines.  She was unable to tolerate metoprolol.  She had hives with lisinopril.  She had a reaction to amlodipine.  Past Medical History:  Diagnosis Date   Allergic rhinitis due to pollen    CONSTIPATION    Essential hypertension 09/21/2008   Qualifier: Diagnosis of  By: Kem Parkinson     GERD    MITRAL VALVE PROLAPSE    Nonrheumatic mitral (valve) prolapse    PREMATURE ATRIAL CONTRACTIONS    Solitary pulmonary nodule     Past Surgical History:  Procedure Laterality Date   BACK SURGERY     LAPAROSCOPIC HYSTERECTOMY      Current Medications: Current Meds  Medication Sig   carvedilol (COREG) 3.125 MG tablet Take 1 tablet (3.125 mg total) by mouth 2 (two) times daily.     Allergies:   Clindamycin, Levaquin [levofloxacin], Lisinopril, and  Erythromycin   Social History   Socioeconomic History   Marital status: Married    Spouse name: Not on file   Number of children: Not on file   Years of education: Not on file   Highest education level: Not on file  Occupational History   Not on file  Tobacco Use   Smoking status: Never   Smokeless tobacco: Never  Substance and Sexual Activity   Alcohol use: No   Drug use: Not on file   Sexual activity: Not on file  Other Topics Concern   Not on file  Social History Narrative   Not on file   Social Determinants of Health   Financial Resource Strain: Not on file  Food Insecurity: Not on file  Transportation Needs: Not on file  Physical Activity: Not on file  Stress: Not on file  Social Connections: Not on file     Family History: The patient's family history includes Heart disease in an other family member; Hypertension in her mother; Parkinson's disease in her father.  ROS:   Please see the history of present illness.    All other systems reviewed and are negative.  EKGs/Labs/Other Studies Reviewed:    EKG:  EKG is ordered today.  The ekg ordered today demonstrates normal sinus rhythm 98 bpm, nonspecific ST abnormality, occasional PVC  Recent Labs: 03/11/2020: ALT 15;  BUN 21; Creatinine, Ser 1.04; Hemoglobin 13.7; Platelets 250; Potassium 4.4; Sodium 142  Recent Lipid Panel    Component Value Date/Time   CHOL 249 (H) 03/11/2020 0900   TRIG 103 03/11/2020 0900   HDL 63 03/11/2020 0900   CHOLHDL 4.0 03/11/2020 0900   LDLCALC 168 (H) 03/11/2020 0900     Risk Assessment/Calculations:           Physical Exam:    VS:  BP (!) 164/90   Pulse 98   Ht 5\' 7"  (1.702 m)   Wt 159 lb (72.1 kg)   BMI 24.90 kg/m     Wt Readings from Last 3 Encounters:  08/23/20 159 lb (72.1 kg)  03/11/20 157 lb (71.2 kg)  12/30/19 158 lb (71.7 kg)     GEN:  Well nourished, well developed in no acute distress HEENT: Normal NECK: No JVD; No carotid bruits LYMPHATICS: No  lymphadenopathy CARDIAC: RRR, no murmurs, rubs, gallops RESPIRATORY:  Clear to auscultation without rales, wheezing or rhonchi  ABDOMEN: Soft, non-tender, non-distended MUSCULOSKELETAL:  No edema; No deformity  SKIN: Warm and dry NEUROLOGIC:  Alert and oriented x 3 PSYCHIATRIC:  Normal affect   ASSESSMENT:    1. Essential hypertension   2. Mixed hyperlipidemia    PLAN:    In order of problems listed above:  Discussed management options.  Discussed goal blood pressure.  Reviewed recent labs.  Multiple drug intolerances noted.  It may be difficult to treat her with any medication.  I think it is worth trying carvedilol 3.125 mg twice daily.  Other options would include a thiazide diuretic if she does not tolerate carvedilol.  We will arrange a pharmacy clinic follow-up in 3 to 4 weeks for management of her hypertension.  Recommended that she take her blood pressure 3 days weekly and bring her readings into her follow-up visit.  Discussed specific recommendations about how to check her blood pressure and make sure that she is relaxed and sitting for 5 minutes before recording her reading. Management with diet and exercise.  I do not think she will tolerate lipid-lowering therapy based on her difficulty tolerating any medications.  She has no family history of premature CAD.  LDL cholesterol is 168, total cholesterol 249, HDL cholesterol 63.      Medication Adjustments/Labs and Tests Ordered: Current medicines are reviewed at length with the patient today.  Concerns regarding medicines are outlined above.  Orders Placed This Encounter  Procedures   EKG 12-Lead    Meds ordered this encounter  Medications   carvedilol (COREG) 3.125 MG tablet    Sig: Take 1 tablet (3.125 mg total) by mouth 2 (two) times daily.    Dispense:  180 tablet    Refill:  3     Patient Instructions  Medication Instructions:  1) START Carvedilol 3.125mg  twice daily  *If you need a refill on your cardiac  medications before your next appointment, please call your pharmacy*   Lab Work: None If you have labs (blood work) drawn today and your tests are completely normal, you will receive your results only by: MyChart Message (if you have MyChart) OR A paper copy in the mail If you have any lab test that is abnormal or we need to change your treatment, we will call you to review the results.   Testing/Procedures: None   Follow-Up:  Your physician recommends that you schedule a follow-up appointment in: 3-4 weeks with our Pharmacy team in the Hypertension Clinic.  At [facility], you and your health needs are our priority.  As part of our continuing mission to provide you with exceptional heart care, we have created designated Provider Care Teams.  These Care Teams include your primary Cardiologist (physician) and Advanced Practice Providers (APPs -  Physician Assistants and Nurse Practitioners) who all work together to provide you with the care you need, when you need it.  We recommend signing up for the patient portal called "MyChart".  Sign up information is provided on this After Visit Summary.  MyChart is used to connect with patients for Virtual Visits (Telemedicine).  Patients are able to view lab/test results, encounter notes, upcoming appointments, etc.  Non-urgent messages can be sent to your provider as well.   To learn more about what you can do with MyChart, go to [email or web address].    Your next appointment:   1 year(s)  The format for your next appointment:   In Person  Provider:   You may see Tonny Bollman, MD or one of the following Advanced Practice Providers on your designated Care Team:   Tereso Newcomer, PA-C Chelsea Aus, [place]   Other Instructions     Signed, Tonny Bollman, MD  08/23/2020 9:38 AM    [facility]

## 2020-08-23 NOTE — Patient Instructions (Signed)
Medication Instructions:  1) START Carvedilol 3.125mg  twice daily  *If you need a refill on your cardiac medications before your next appointment, please call your pharmacy*   Lab Work: None If you have labs (blood work) drawn today and your tests are completely normal, you will receive your results only by: MyChart Message (if you have MyChart) OR A paper copy in the mail If you have any lab test that is abnormal or we need to change your treatment, we will call you to review the results.   Testing/Procedures: None   Follow-Up:  Your physician recommends that you schedule a follow-up appointment in: 3-4 weeks with our Pharmacy team in the Hypertension Clinic.   At [facility], you and your health needs are our priority.  As part of our continuing mission to provide you with exceptional heart care, we have created designated Provider Care Teams.  These Care Teams include your primary Cardiologist (physician) and Advanced Practice Providers (APPs -  Physician Assistants and Nurse Practitioners) who all work together to provide you with the care you need, when you need it.  We recommend signing up for the patient portal called "MyChart".  Sign up information is provided on this After Visit Summary.  MyChart is used to connect with patients for Virtual Visits (Telemedicine).  Patients are able to view lab/test results, encounter notes, upcoming appointments, etc.  Non-urgent messages can be sent to your provider as well.   To learn more about what you can do with MyChart, go to [email or web address].    Your next appointment:   1 year(s)  The format for your next appointment:   In Person  Provider:   You may see Tonny Bollman, MD or one of the following Advanced Practice Providers on your designated Care Team:   Tereso Newcomer, PA-C Chelsea Aus, [place]   Other Instructions

## 2020-09-13 ENCOUNTER — Ambulatory Visit: Payer: Medicare PPO

## 2020-09-18 ENCOUNTER — Telehealth: Payer: Self-pay

## 2020-09-18 NOTE — Telephone Encounter (Signed)
Called pt left a VM to call the office to schedule her annual wellness visit that can be done over the phone.  PEC nurse may give results to patient if they return call to clinic, a CRM has been created.   KP

## 2020-11-26 DIAGNOSIS — C44622 Squamous cell carcinoma of skin of right upper limb, including shoulder: Secondary | ICD-10-CM | POA: Diagnosis not present

## 2020-11-26 DIAGNOSIS — Z08 Encounter for follow-up examination after completed treatment for malignant neoplasm: Secondary | ICD-10-CM | POA: Diagnosis not present

## 2020-11-26 DIAGNOSIS — R229 Localized swelling, mass and lump, unspecified: Secondary | ICD-10-CM | POA: Diagnosis not present

## 2020-11-26 DIAGNOSIS — Z85828 Personal history of other malignant neoplasm of skin: Secondary | ICD-10-CM | POA: Diagnosis not present

## 2020-11-26 DIAGNOSIS — L501 Idiopathic urticaria: Secondary | ICD-10-CM | POA: Diagnosis not present

## 2020-11-26 DIAGNOSIS — L821 Other seborrheic keratosis: Secondary | ICD-10-CM | POA: Diagnosis not present

## 2021-02-19 IMAGING — DX DG CHEST 1V PORT
1 series · 1 of 1 positions shown · non-contrast
Comparison: January 23, 2017

CLINICAL DATA: Chest tightness.

EXAM:
PORTABLE CHEST 1 VIEW

[chest]
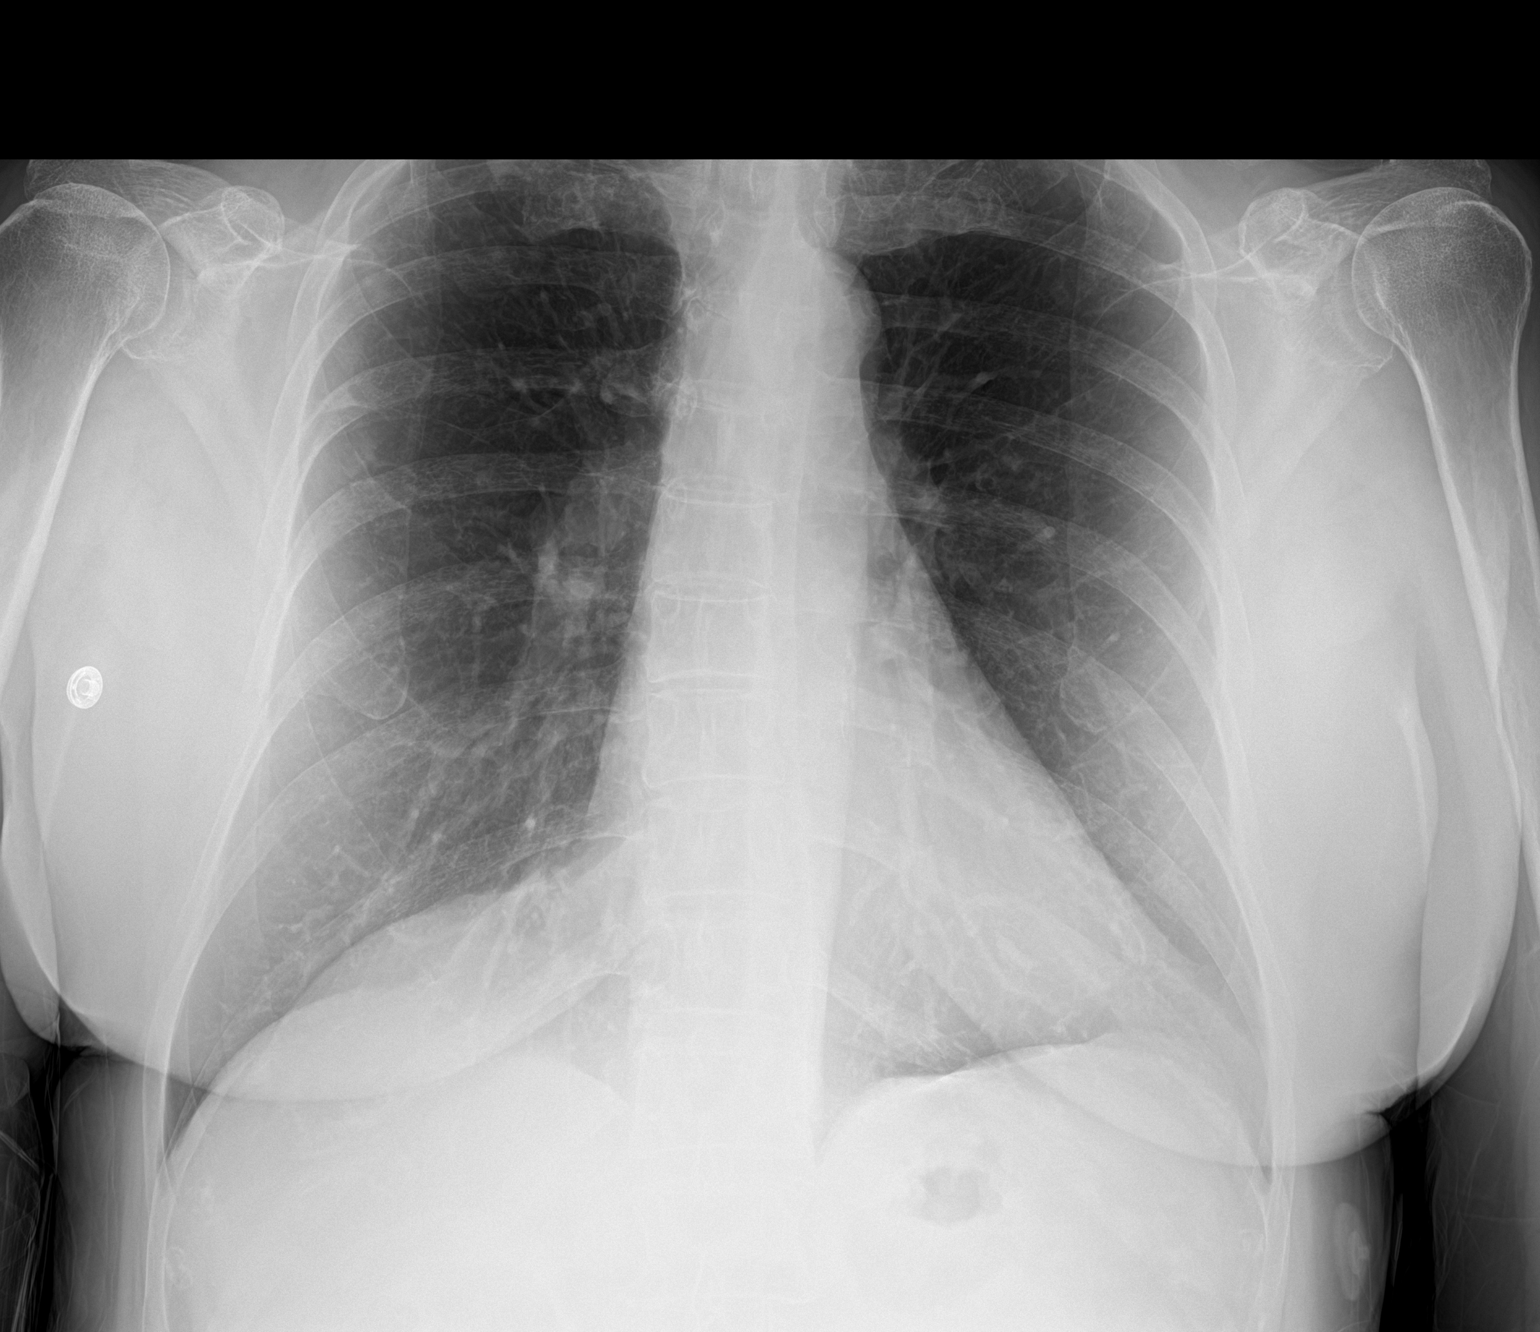

[1 of 1 positions shown; findings below may reference images not displayed]

FINDINGS: The heart size and mediastinal contours are within normal limits.
Both lungs are clear. The visualized skeletal structures are
unremarkable.
IMPRESSION: No active disease.

## 2021-04-11 ENCOUNTER — Other Ambulatory Visit: Payer: Self-pay

## 2021-04-11 ENCOUNTER — Ambulatory Visit: Payer: Medicare PPO | Admitting: Legal Medicine

## 2021-04-11 ENCOUNTER — Encounter: Payer: Self-pay | Admitting: Legal Medicine

## 2021-04-11 VITALS — BP 180/90 | HR 76 | Temp 98.6°F | Resp 15 | Ht 67.0 in | Wt 159.0 lb

## 2021-04-11 DIAGNOSIS — I10 Essential (primary) hypertension: Secondary | ICD-10-CM

## 2021-04-11 DIAGNOSIS — Z91199 Patient's noncompliance with other medical treatment and regimen due to unspecified reason: Secondary | ICD-10-CM | POA: Diagnosis not present

## 2021-04-11 DIAGNOSIS — E782 Mixed hyperlipidemia: Secondary | ICD-10-CM

## 2021-04-11 NOTE — Progress Notes (Signed)
? ?Subjective:  ?Patient ID: [name] [name], female    DOB: Jun 13, 1945  Age: 76 y.o. MRN: [id number] ? ?Chief Complaint  ?Patient presents with  ? Hypertension  ? Bloated  ? ? ?HPI ?Patient  said that her blood pressure this weekend was systolic 175-205 and diastolic 85-100. She was not taking any blood pressure. She took carvedilol one yesterday and one this morning. She never started after cardiology gave her. ? ?She also has been feeling bloating, chills and everytime she eats, she has bowel movement. She denied diarrhea, nausea. ?Current Outpatient Medications on File Prior to Visit  ?Medication Sig Dispense Refill  ? carvedilol (COREG) 3.125 MG tablet Take 1 tablet (3.125 mg total) by mouth 2 (two) times daily. 180 tablet 3  ? ?No current facility-administered medications on file prior to visit.  ? ?Past Medical History:  ?Diagnosis Date  ? Allergic rhinitis due to pollen   ? CONSTIPATION   ? Essential hypertension 09/21/2008  ? Qualifier: Diagnosis of  By: Kem Parkinson    ? GERD   ? MITRAL VALVE PROLAPSE   ? Nonrheumatic mitral (valve) prolapse   ? PREMATURE ATRIAL CONTRACTIONS   ? Solitary pulmonary nodule   ? ?Past Surgical History:  ?Procedure Laterality Date  ? BACK SURGERY    ? LAPAROSCOPIC HYSTERECTOMY    ?  ?Family History  ?Problem Relation Age of Onset  ? Heart disease Other   ? Hypertension Mother   ? Parkinson's disease Father   ? ?Social History  ? ?Socioeconomic History  ? Marital status: Married  ?  Spouse name: Not on file  ? Number of children: Not on file  ? Years of education: Not on file  ? Highest education level: Not on file  ?Occupational History  ? Not on file  ?Tobacco Use  ? Smoking status: Never  ? Smokeless tobacco: Never  ?Substance and Sexual Activity  ? Alcohol use: No  ? Drug use: Not on file  ? Sexual activity: Not on file  ?Other Topics Concern  ? Not on file  ?Social History Narrative  ? Not on file  ? ?Social Determinants of Health  ? ?Financial Resource Strain: Not on file   ?Food Insecurity: Not on file  ?Transportation Needs: Not on file  ?Physical Activity: Not on file  ?Stress: Not on file  ?Social Connections: Not on file  ? ? ?Review of Systems  ?Constitutional: Negative.  Negative for activity change and appetite change.  ?HENT: Negative.  Negative for congestion.   ?Eyes:  Negative for visual disturbance.  ?Respiratory:  Negative for chest tightness.   ?Cardiovascular:  Negative for chest pain.  ?Endocrine: Negative for polyuria.  ?Genitourinary:  Negative for difficulty urinating and dyspareunia.  ?Musculoskeletal: Negative.   ?Neurological:  Positive for headaches.  ?Psychiatric/Behavioral: Negative.    ? ? ?Objective:  ?BP (!) 180/90   Pulse 76   Temp 98.6 ?F (37 ?C)   Resp 15   Ht 5\' 7"  (1.702 m)   Wt 159 lb (72.1 kg)   SpO2 98%   BMI 24.90 kg/m?  ? ?BP/Weight 04/11/2021 08/23/2020 03/11/2020  ?Systolic BP 180 164 138  ?Diastolic BP 90 90 88  ?Wt. (Lbs) 159 159 157  ?BMI 24.9 24.9 24.59  ? ? ?Physical Exam ?Vitals reviewed.  ?Constitutional:   ?   General: She is not in acute distress. ?   Appearance: Normal appearance.  ?HENT:  ?   Head: Normocephalic.  ?Eyes:  ?   Extraocular  Movements: Extraocular movements intact.  ?   Conjunctiva/sclera: Conjunctivae normal.  ?   Pupils: Pupils are equal, round, and reactive to light.  ?Cardiovascular:  ?   Rate and Rhythm: Normal rate and regular rhythm.  ?   Pulses: Normal pulses.  ?   Heart sounds: Normal heart sounds. No murmur heard. ?  No gallop.  ?Pulmonary:  ?   Effort: Pulmonary effort is normal. No respiratory distress.  ?   Breath sounds: Normal breath sounds. No wheezing.  ?Abdominal:  ?   General: Abdomen is flat. Bowel sounds are normal. There is no distension.  ?   Tenderness: There is no abdominal tenderness.  ?Musculoskeletal:  ?   Right lower leg: No edema.  ?   Left lower leg: No edema.  ?Skin: ?   Capillary Refill: Capillary refill takes less than 2 seconds.  ?Neurological:  ?   General: No focal deficit present.   ?   Mental Status: She is alert and oriented to person, place, and time. Mental status is at baseline.  ? ? ? ?  ? ?Lab Results  ?Component Value Date  ? WBC 7.7 03/11/2020  ? HGB 13.7 03/11/2020  ? HCT 42.1 03/11/2020  ? PLT 250 03/11/2020  ? GLUCOSE 93 03/11/2020  ? CHOL 249 (H) 03/11/2020  ? TRIG 103 03/11/2020  ? HDL 63 03/11/2020  ? LDLCALC 168 (H) 03/11/2020  ? ALT 15 03/11/2020  ? AST 23 03/11/2020  ? NA 142 03/11/2020  ? K 4.4 03/11/2020  ? CL 105 03/11/2020  ? CREATININE 1.04 (H) 03/11/2020  ? BUN 21 03/11/2020  ? CO2 21 03/11/2020  ? TSH 1.290 02/02/2016  ? HGBA1C 5.5 02/02/2016  ? ? ? ? ?Assessment & Plan:  ? ?Essential hypertension ?-     CBC with Differential/Platelet ?-     Comprehensive metabolic panel ?Patient has poorly controlled hypertension, she did not start coreg, she took one yesterday and feel better, we discussed the need for systolic BP control and to cut her risks of stroke and CV disease ? ?Noncompliance ?Patient has been poorly compliant with BP medicines in past ? ?Mixed hyperlipidemia ?-     Lipid panel drawn ? ?  ? ?  ? ?Follow-up: Return in about 2 weeks (around 04/25/2021) for bp check. ? ?An After Visit Summary was printed and given to the patient. ? ?Brent Bulla, MD ?[facility] ?[phone] ?

## 2021-04-12 LAB — CBC WITH DIFFERENTIAL/PLATELET
Basophils Absolute: 0 10*3/uL (ref 0.0–0.2)
Basos: 0 %
EOS (ABSOLUTE): 0.1 10*3/uL (ref 0.0–0.4)
Eos: 1 %
Hematocrit: 38.3 % (ref 34.0–46.6)
Hemoglobin: 13.4 g/dL (ref 11.1–15.9)
Immature Grans (Abs): 0 10*3/uL (ref 0.0–0.1)
Immature Granulocytes: 0 %
Lymphocytes Absolute: 2 10*3/uL (ref 0.7–3.1)
Lymphs: 21 %
MCH: 30.6 pg (ref 26.6–33.0)
MCHC: 35 g/dL (ref 31.5–35.7)
MCV: 87 fL (ref 79–97)
Monocytes Absolute: 0.6 10*3/uL (ref 0.1–0.9)
Monocytes: 6 %
Neutrophils Absolute: 7.2 10*3/uL — ABNORMAL HIGH (ref 1.4–7.0)
Neutrophils: 72 %
Platelets: 248 10*3/uL (ref 150–450)
RBC: 4.38 x10E6/uL (ref 3.77–5.28)
RDW: 12.6 % (ref 11.7–15.4)
WBC: 10 10*3/uL (ref 3.4–10.8)

## 2021-04-12 LAB — LIPID PANEL
Chol/HDL Ratio: 3.7 ratio (ref 0.0–4.4)
Cholesterol, Total: 251 mg/dL — ABNORMAL HIGH (ref 100–199)
HDL: 68 mg/dL (ref >39)
LDL Chol Calc (NIH): 168 mg/dL — ABNORMAL HIGH (ref 0–99)
Triglycerides: 88 mg/dL (ref 0–149)
VLDL Cholesterol Cal: 15 mg/dL (ref 5–40)

## 2021-04-12 LAB — CARDIOVASCULAR RISK ASSESSMENT

## 2021-04-12 LAB — COMPREHENSIVE METABOLIC PANEL
ALT: 18 IU/L (ref 0–32)
AST: 32 IU/L (ref 0–40)
Albumin/Globulin Ratio: 2 (ref 1.2–2.2)
Albumin: 4.5 g/dL (ref 3.7–4.7)
Alkaline Phosphatase: 74 IU/L (ref 44–121)
BUN/Creatinine Ratio: 23 (ref 12–28)
BUN: 21 mg/dL (ref 8–27)
Bilirubin Total: 0.3 mg/dL (ref 0.0–1.2)
CO2: 24 mmol/L (ref 20–29)
Calcium: 9.7 mg/dL (ref 8.7–10.3)
Chloride: 104 mmol/L (ref 96–106)
Creatinine, Ser: 0.9 mg/dL (ref 0.57–1.00)
Globulin, Total: 2.2 g/dL (ref 1.5–4.5)
Glucose: 88 mg/dL (ref 70–99)
Potassium: 4.3 mmol/L (ref 3.5–5.2)
Sodium: 143 mmol/L (ref 134–144)
Total Protein: 6.7 g/dL (ref 6.0–8.5)
eGFR: 66 mL/min/{1.73_m2} (ref >59)

## 2021-04-12 NOTE — Progress Notes (Signed)
Cbc normal, LDL cholesterol very high 527 strongly recommend statin, kidney and liver tests normal,  ?lp

## 2021-04-26 ENCOUNTER — Encounter: Payer: Self-pay | Admitting: Legal Medicine

## 2021-04-26 ENCOUNTER — Ambulatory Visit: Payer: Medicare PPO | Admitting: Legal Medicine

## 2021-04-26 VITALS — BP 136/78 | HR 78 | Temp 99.3°F | Resp 15 | Ht 67.0 in | Wt 161.0 lb

## 2021-04-26 DIAGNOSIS — I10 Essential (primary) hypertension: Secondary | ICD-10-CM

## 2021-04-26 NOTE — Progress Notes (Signed)
? ?Subjective:  ?Patient ID: [name] [name], female    DOB: 05-Oct-1945  Age: 76 y.o. MRN: [id number] ? ?Chief Complaint  ?Patient presents with  ? Hypertension  ? ? ?HPI ?Patient presents for follow up of hypertension.  Patient tolerating carvedilol 3.125 twice a day well without side effects.  Patient was diagnosed with hypertension and has been treated for hypertension for 2 years.Patient is working on maintaining diet and exercise regimen and follows up as directed.  ? ?She has been checking her blood pressure at home last 2 weeks and systolic range is 183-132 and diastolic 84-62. She denied chest pain, sob, headache. BP 132 today at home.  N headaches and feeling well. ?Current Outpatient Medications on File Prior to Visit  ?Medication Sig Dispense Refill  ? carvedilol (COREG) 3.125 MG tablet Take 1 tablet (3.125 mg total) by mouth 2 (two) times daily. 180 tablet 3  ? ?No current facility-administered medications on file prior to visit.  ? ?Past Medical History:  ?Diagnosis Date  ? Allergic rhinitis due to pollen   ? CONSTIPATION   ? Essential hypertension 09/21/2008  ? Qualifier: Diagnosis of  By: Kem Parkinson    ? GERD   ? MITRAL VALVE PROLAPSE   ? Nonrheumatic mitral (valve) prolapse   ? PREMATURE ATRIAL CONTRACTIONS   ? Solitary pulmonary nodule   ? ?Past Surgical History:  ?Procedure Laterality Date  ? BACK SURGERY    ? LAPAROSCOPIC HYSTERECTOMY    ?  ?Family History  ?Problem Relation Age of Onset  ? Heart disease Other   ? Hypertension Mother   ? Parkinson's disease Father   ? ?Social History  ? ?Socioeconomic History  ? Marital status: Married  ?  Spouse name: Not on file  ? Number of children: Not on file  ? Years of education: Not on file  ? Highest education level: Not on file  ?Occupational History  ? Not on file  ?Tobacco Use  ? Smoking status: Never  ? Smokeless tobacco: Never  ?Substance and Sexual Activity  ? Alcohol use: No  ? Drug use: Never  ? Sexual activity: Not Currently  ?Other Topics  Concern  ? Not on file  ?Social History Narrative  ? Not on file  ? ?Social Determinants of Health  ? ?Financial Resource Strain: Not on file  ?Food Insecurity: Not on file  ?Transportation Needs: Not on file  ?Physical Activity: Not on file  ?Stress: Not on file  ?Social Connections: Not on file  ? ? ?Review of Systems  ?Constitutional:  Negative for chills, fatigue and fever.  ?HENT:  Negative for congestion, ear pain and sore throat.   ?Respiratory:  Negative for cough and shortness of breath.   ?Cardiovascular:  Negative for chest pain and palpitations.  ?Gastrointestinal:  Positive for constipation. Negative for abdominal pain, diarrhea, nausea and vomiting.  ?Endocrine: Negative for polydipsia, polyphagia and polyuria.  ?Genitourinary:  Negative for difficulty urinating and dysuria.  ?Musculoskeletal:  Negative for arthralgias, back pain and myalgias.  ?Skin:  Negative for rash.  ?Neurological:  Negative for light-headedness and headaches.  ?Psychiatric/Behavioral:  Negative for dysphoric mood. The patient is not nervous/anxious.   ? ? ?Objective:  ?BP 136/78   Pulse 78   Temp 99.3 ?F (37.4 ?C)   Resp 15   Ht 5\' 7"  (1.702 m)   Wt 161 lb (73 kg)   SpO2 100%   BMI 25.22 kg/m?  ? ? ?  04/26/2021  ?  3:07 PM  04/11/2021  ?  3:02 PM 08/23/2020  ?  8:42 AM  ?BP/Weight  ?Systolic BP 136 180 164  ?Diastolic BP 78 90 90  ?Wt. (Lbs) 161 159 159  ?BMI 25.22 kg/m2 24.9 kg/m2 24.9 kg/m2  ? ? ?Physical Exam ?Vitals reviewed.  ?Constitutional:   ?   General: She is not in acute distress. ?   Appearance: Normal appearance.  ?HENT:  ?   Right Ear: Tympanic membrane normal.  ?   Left Ear: Tympanic membrane normal.  ?   Nose: Nose normal.  ?   Mouth/Throat:  ?   Mouth: Mucous membranes are moist.  ?Eyes:  ?   Conjunctiva/sclera: Conjunctivae normal.  ?   Pupils: Pupils are equal, round, and reactive to light.  ?Cardiovascular:  ?   Rate and Rhythm: Normal rate and regular rhythm.  ?   Pulses: Normal pulses.  ?   Heart sounds:  Normal heart sounds. No murmur heard. ?  No gallop.  ?Pulmonary:  ?   Effort: Pulmonary effort is normal. No respiratory distress.  ?   Breath sounds: Normal breath sounds. No wheezing.  ?Abdominal:  ?   General: Abdomen is flat. Bowel sounds are normal. There is no distension.  ?   Palpations: Abdomen is soft.  ?   Tenderness: There is no abdominal tenderness.  ?Musculoskeletal:     ?   General: Normal range of motion.  ?   Cervical back: Normal range of motion and neck supple.  ?   Right lower leg: No edema.  ?   Left lower leg: No edema.  ?Skin: ?   General: Skin is warm.  ?   Capillary Refill: Capillary refill takes less than 2 seconds.  ?Neurological:  ?   General: No focal deficit present.  ?   Mental Status: She is alert and oriented to person, place, and time. Mental status is at baseline.  ?   Gait: Gait normal.  ?   Deep Tendon Reflexes: Reflexes normal.  ?Psychiatric:     ?   Mood and Affect: Mood normal.     ?   Thought Content: Thought content normal.  ? ? ? ?  ? ?Lab Results  ?Component Value Date  ? WBC 10.0 04/11/2021  ? HGB 13.4 04/11/2021  ? HCT 38.3 04/11/2021  ? PLT 248 04/11/2021  ? GLUCOSE 88 04/11/2021  ? CHOL 251 (H) 04/11/2021  ? TRIG 88 04/11/2021  ? HDL 68 04/11/2021  ? LDLCALC 168 (H) 04/11/2021  ? ALT 18 04/11/2021  ? AST 32 04/11/2021  ? NA 143 04/11/2021  ? K 4.3 04/11/2021  ? CL 104 04/11/2021  ? CREATININE 0.90 04/11/2021  ? BUN 21 04/11/2021  ? CO2 24 04/11/2021  ? TSH 1.290 02/02/2016  ? HGBA1C 5.5 02/02/2016  ? ? ? ? ?Assessment & Plan:  ? ?Problem List Items Addressed This Visit   ? ?  ? Cardiovascular and Mediastinum  ? Essential hypertension - Primary ?An individual hypertension care plan was established and reinforced today.  The patient's status was assessed using clinical findings on exam and labs or diagnostic tests. The patient's success at meeting treatment goals on disease specific evidence-based guidelines and found to be well controlled. ?SELF MANAGEMENT: The patient  and I together assessed ways to personally work towards obtaining the recommended goals. ?RECOMMENDATIONS: avoid decongestants found in common cold remedies, decrease consumption of alcohol, perform routine monitoring of BP with home BP cuff, exercise, reduction of dietary salt, take  medicines as prescribed, try not to miss doses and quit smoking.  Regular exercise and maintaining a healthy weight is needed.  Stress reduction may help. ?A CLINICAL SUMMARY including written plan identify barriers to care unique to individual due to social or financial issues.  We attempt to mutually creat solutions for individual and family understanding.   ? ? ? ? ?  ? ?Follow-up: Return if symptoms worsen or fail to improve. ? ?An After Visit Summary was printed and given to the patient. ? ?Brent BullaLawrence Christianjames Soule, MD ?[facility] ?[phone]) [phone] ?

## 2021-05-16 DIAGNOSIS — Z01419 Encounter for gynecological examination (general) (routine) without abnormal findings: Secondary | ICD-10-CM | POA: Diagnosis not present

## 2021-05-16 DIAGNOSIS — Z6825 Body mass index (BMI) 25.0-25.9, adult: Secondary | ICD-10-CM | POA: Diagnosis not present

## 2021-05-16 DIAGNOSIS — R635 Abnormal weight gain: Secondary | ICD-10-CM | POA: Diagnosis not present

## 2021-05-16 DIAGNOSIS — Z1231 Encounter for screening mammogram for malignant neoplasm of breast: Secondary | ICD-10-CM | POA: Diagnosis not present

## 2021-05-16 DIAGNOSIS — F419 Anxiety disorder, unspecified: Secondary | ICD-10-CM | POA: Diagnosis not present

## 2021-06-01 DIAGNOSIS — L821 Other seborrheic keratosis: Secondary | ICD-10-CM | POA: Diagnosis not present

## 2021-06-01 DIAGNOSIS — Z85828 Personal history of other malignant neoplasm of skin: Secondary | ICD-10-CM | POA: Diagnosis not present

## 2021-06-01 DIAGNOSIS — Z08 Encounter for follow-up examination after completed treatment for malignant neoplasm: Secondary | ICD-10-CM | POA: Diagnosis not present

## 2021-06-01 DIAGNOSIS — D225 Melanocytic nevi of trunk: Secondary | ICD-10-CM | POA: Diagnosis not present

## 2021-07-01 NOTE — Telephone Encounter (Signed)
LVM for pt to call back as soon as possible.  ? ?RE: schedule AWV ?

## 2021-07-07 DIAGNOSIS — H2513 Age-related nuclear cataract, bilateral: Secondary | ICD-10-CM | POA: Diagnosis not present

## 2021-07-07 DIAGNOSIS — H18413 Arcus senilis, bilateral: Secondary | ICD-10-CM | POA: Diagnosis not present

## 2021-07-07 DIAGNOSIS — H2512 Age-related nuclear cataract, left eye: Secondary | ICD-10-CM | POA: Diagnosis not present

## 2021-07-07 DIAGNOSIS — H25043 Posterior subcapsular polar age-related cataract, bilateral: Secondary | ICD-10-CM | POA: Diagnosis not present

## 2021-07-07 DIAGNOSIS — H353132 Nonexudative age-related macular degeneration, bilateral, intermediate dry stage: Secondary | ICD-10-CM | POA: Diagnosis not present

## 2021-09-23 DIAGNOSIS — H353132 Nonexudative age-related macular degeneration, bilateral, intermediate dry stage: Secondary | ICD-10-CM | POA: Diagnosis not present

## 2021-09-23 DIAGNOSIS — H43812 Vitreous degeneration, left eye: Secondary | ICD-10-CM | POA: Diagnosis not present

## 2021-10-10 ENCOUNTER — Other Ambulatory Visit: Payer: Self-pay | Admitting: Cardiovascular Disease

## 2021-10-10 ENCOUNTER — Other Ambulatory Visit: Payer: Self-pay

## 2021-10-10 DIAGNOSIS — H52201 Unspecified astigmatism, right eye: Secondary | ICD-10-CM | POA: Diagnosis not present

## 2021-10-10 DIAGNOSIS — H2511 Age-related nuclear cataract, right eye: Secondary | ICD-10-CM | POA: Diagnosis not present

## 2021-10-10 MED ORDER — CARVEDILOL 3.125 MG PO TABS: 3.1250 mg | ORAL_TABLET | Freq: Two times a day (BID) | ORAL | 0 refills | Status: DC

## 2021-10-10 NOTE — Telephone Encounter (Signed)
Pt's medication was sent to pt's pharmacy as requested. Confirmation received.  °

## 2021-10-11 DIAGNOSIS — H25012 Cortical age-related cataract, left eye: Secondary | ICD-10-CM | POA: Diagnosis not present

## 2021-10-11 DIAGNOSIS — H2512 Age-related nuclear cataract, left eye: Secondary | ICD-10-CM | POA: Diagnosis not present

## 2021-10-11 DIAGNOSIS — H25042 Posterior subcapsular polar age-related cataract, left eye: Secondary | ICD-10-CM | POA: Diagnosis not present

## 2021-10-24 DIAGNOSIS — H2512 Age-related nuclear cataract, left eye: Secondary | ICD-10-CM | POA: Diagnosis not present

## 2021-10-24 DIAGNOSIS — H52202 Unspecified astigmatism, left eye: Secondary | ICD-10-CM | POA: Diagnosis not present

## 2021-11-01 ENCOUNTER — Ambulatory Visit: Payer: Medicare PPO | Admitting: Legal Medicine

## 2021-11-08 ENCOUNTER — Other Ambulatory Visit: Payer: Self-pay

## 2021-11-08 MED ORDER — CARVEDILOL 3.125 MG PO TABS: 3.1250 mg | ORAL_TABLET | Freq: Two times a day (BID) | ORAL | 4 refills | Status: DC

## 2021-11-08 NOTE — Telephone Encounter (Signed)
Pt's medication was sent to pt's pharmacy as requested. Confirmation received.  °

## 2021-11-16 ENCOUNTER — Telehealth: Payer: Self-pay | Admitting: Cardiovascular Disease

## 2021-11-16 NOTE — Telephone Encounter (Signed)
Pt c/o BP issue: STAT if pt c/o blurred vision, one-sided weakness or slurred speech  1. What are your last 5 BP readings?   10/23: 204/89 76 (7:30 PM)            *took Carvedilol at 9:00 PM)            207/74 70  10/25: 200/87 66 (AM)           167/75 72 (1:47 PM)  2. Are you having any other symptoms (ex. Dizziness, headache, blurred vision, passed out)?  Dull headache   3. What is your BP issue?   Patient states her BP is remaining elevated and she assumes BP medication is not helping at all.

## 2021-11-17 NOTE — Telephone Encounter (Signed)
Returned call to patient, no answer. Left voicemail asking her to call back. Pt last seen here by [facility] on 08/23/20: She has a lot of issues with anxiety around her blood pressure.  She has whitecoat hypertension and gets very stressed out about coming to see a doctor.  I know the patient from caring for multiple family members.  She is here with her husband today.  She has had a great deal of difficulty tolerating antihypertensive medicines.  She was unable to tolerate metoprolol.  She had hives with lisinopril.  She had a reaction to amlodipine. Discussed management options.  Discussed goal blood pressure.  Reviewed recent labs.  Multiple drug intolerances noted.  It may be difficult to treat her with any medication.  I think it is worth trying carvedilol 3.125 mg twice daily.  Other options would include a thiazide diuretic if she does not tolerate carvedilol.  We will arrange a pharmacy clinic follow-up in 3 to 4 weeks for management of her hypertension.  Recommended that she take her blood pressure 3 days weekly and bring her readings into her follow-up visit.  Discussed specific recommendations about how to check her blood pressure and make sure that she is relaxed and sitting for 5 minutes before recording her reading. Patient cancelled PharmD appt and never rescheduled. She was advised to f/u w/[facility] in 1 year and that's not scheduled either. She saw PCP Henrene Pastor on 04/11/21 for HTN and per documentation, was not taking the Coreg-she had taken 2 doses, only as needed when she checked her BP. MD advised her to take daily and f/u in 1-2 weeks. Pt again seen by PCP on 04/26/21 where she states she was taking Coreg 3.125mg  BID and reported home BP's 132-183/62-84. MD made no additional med changes, but provided education on diet, smoking, and routine checking.

## 2021-11-17 NOTE — Telephone Encounter (Signed)
Pt returned call stating that since PCP visit she has not been checking her BP because "I was feeling good." Day she went for cataract surgery, was told that her BP was high, not sure what it was. Pt states she didn't check it at home after this either. Began feeling poorly, got tired easily last Friday, did lots of yard work and pulled weeds and did on Saturday as well. Had to rest several times which she states is abnormal. States Sunday she sat around most of the day due to not feeling good, which she defines as tired,no energy, "took it out of me to do laundry." Headache began (dull) on Monday and felt worse. Provides following BP log and condones HR's in 70's w/all readings: 10/23 204/89 (7:30p) Took med at 9p   207/94 (11pm) 10/24 200/84 (8am) 167/75 (1pm) took med at 9am 10/25 187/87 (8a) 148/72 at 10am (an hours 188/87 (7:30p) took med at 9p 181/87 (11p) Patient states that when she takes the Coreg, she doesn't have a lot of energy. She states she can tolerate it, but does not do well with any medication. States she feels better today than what she has felt this week. Does feel tired after cleaning the kitchen and doing less-than-normal activities. Advised pt to watch her intake of foods high in sodium. Moved her appt up to tomorrow with Dr Burt Knack (previously scheduled for 2/24) to address needed med increase and potential need for ECHO (13 yrs since prior) or stress test to assess cardiac function d/t her stamina being severely/abruptly decreased.

## 2021-11-18 ENCOUNTER — Ambulatory Visit: Payer: Medicare PPO | Attending: Cardiovascular Disease | Admitting: Cardiovascular Disease

## 2021-11-18 ENCOUNTER — Encounter: Payer: Self-pay | Admitting: Cardiovascular Disease

## 2021-11-18 VITALS — BP 170/98 | HR 66 | Ht 67.0 in | Wt 158.8 lb

## 2021-11-18 DIAGNOSIS — I10 Essential (primary) hypertension: Secondary | ICD-10-CM

## 2021-11-18 DIAGNOSIS — R0609 Other forms of dyspnea: Secondary | ICD-10-CM | POA: Diagnosis not present

## 2021-11-18 MED ORDER — HYDROCHLOROTHIAZIDE 12.5 MG PO CAPS: 12.5000 mg | ORAL_CAPSULE | Freq: Every day | ORAL | 11 refills | Status: DC

## 2021-11-18 NOTE — Progress Notes (Unsigned)
Cardiology Office Note:    Date:  11/18/2021   ID:  [name], DOB 27-Jan-1945, MRN [id number]  PCP:  [name] Miyamoto, MD   [facility] Providers Cardiologist:  None     Referring MD: [name] [name],*   Chief Complaint  Patient presents with   Hypertension    History of Present Illness:    [name] [name] is a 76 y.o. female with a hx of hypertension, mitral valve prolapse, and heart palpitations, presenting for follow-up evaluation.  She has a longstanding history of whitecoat hypertension.  She has had problems with stress and anxiety.  She has had a lot of difficulty tolerating antihypertensive medications with intolerance to metoprolol, lisinopril, and amlodipine.  The patient is here with her husband today.  She has been having a lot more trouble with her blood pressure of late.  Her readings have been elevated.  She complains of fatigue and shortness of breath with activity.  No chest pain or pressure.  No edema, orthopnea, or PND.  She does not like taking carvedilol but she has remained compliant with taking low-dose carvedilol 3.125 mg twice daily.  She feels like it makes her fatigued.  Past Medical History:  Diagnosis Date   Allergic rhinitis due to pollen    CONSTIPATION    Essential hypertension 09/21/2008   Qualifier: Diagnosis of  By: [name] [name]     GERD    MITRAL VALVE PROLAPSE    Nonrheumatic mitral (valve) prolapse    PREMATURE ATRIAL CONTRACTIONS    Solitary pulmonary nodule     Past Surgical History:  Procedure Laterality Date   BACK SURGERY     LAPAROSCOPIC HYSTERECTOMY      Current Medications: Current Meds  Medication Sig   carvedilol (COREG) 3.125 MG tablet Take 1 tablet (3.125 mg total) by mouth 2 (two) times daily.   hydrochlorothiazide (MICROZIDE) 12.5 MG capsule Take 1 capsule (12.5 mg total) by mouth daily.   ketorolac (ACULAR) 0.5 % ophthalmic solution Place into the left eye 4 (four) times daily. Taking in  left eye   prednisoLONE acetate (PRED FORTE) 1 % ophthalmic suspension Per patient taking     Allergies:   Clindamycin, Levaquin [levofloxacin], Lisinopril, Tru-micin [misc natural products], and Erythromycin   Social History   Socioeconomic History   Marital status: Married    Spouse name: Not on file   Number of children: Not on file   Years of education: Not on file   Highest education level: Not on file  Occupational History   Not on file  Tobacco Use   Smoking status: Never   Smokeless tobacco: Never  Substance and Sexual Activity   Alcohol use: No   Drug use: Never   Sexual activity: Not Currently  Other Topics Concern   Not on file  Social History Narrative   Not on file   Social Determinants of Health   Financial Resource Strain: Not on file  Food Insecurity: Not on file  Transportation Needs: Not on file  Physical Activity: Not on file  Stress: Not on file  Social Connections: Not on file     Family History: The patient's family history includes Heart disease in an other family member; Hypertension in her mother; [name]'s disease in her father.  ROS:   Please see the history of present illness.    All other systems reviewed and are negative.  EKGs/Labs/Other Studies Reviewed:     EKG:  EKG is ordered today.  The ekg ordered today demonstrates normal sinus rhythm 66 bpm, PAC, nonspecific ST abnormality.  Recent Labs: 04/11/2021: ALT 18; BUN 21; Creatinine, Ser 0.90; Hemoglobin 13.4; Platelets 248; Potassium 4.3; Sodium 143  Recent Lipid Panel    Component Value Date/Time   CHOL 251 (H) 04/11/2021 1556   TRIG 88 04/11/2021 1556   HDL 68 04/11/2021 1556   CHOLHDL 3.7 04/11/2021 1556   LDLCALC 168 (H) 04/11/2021 1556     Risk Assessment/Calculations:      HYPERTENSION CONTROL Vitals:   11/18/21 1641 11/18/21 1707  BP: (!) 164/100 (!) 170/98    The patient's blood pressure is elevated above target today. {Click here if intervention needs  to be changed Refresh Note :1}  In order to address the patient's elevated BP: A new medication was prescribed today.            Physical Exam:    VS:  BP (!) 170/98   Pulse 66   Ht 5\' 7"  (1.702 m)   Wt 158 lb 12.8 oz (72 kg)   SpO2 97%   BMI 24.87 kg/m     Wt Readings from Last 3 Encounters:  11/18/21 158 lb 12.8 oz (72 kg)  04/26/21 161 lb (73 kg)  04/11/21 159 lb (72.1 kg)     GEN:  Well nourished, well developed in no acute distress HEENT: Normal NECK: No JVD; No carotid bruits LYMPHATICS: No lymphadenopathy CARDIAC: RRR, no murmurs, rubs, gallops RESPIRATORY:  Clear to auscultation without rales, wheezing or rhonchi  ABDOMEN: Soft, non-tender, non-distended MUSCULOSKELETAL:  No edema; No deformity  SKIN: Warm and dry NEUROLOGIC:  Alert and oriented x 3 PSYCHIATRIC:  Normal affect   ASSESSMENT:    1. Essential hypertension   2. Dyspnea on exertion    PLAN:    In order of problems listed above:  Blood pressure is uncontrolled.  Reviewed potential treatment options with her.  Discussed how to best take her blood pressure at home with getting in a relaxed state of mind and checking her blood pressure after sitting for 5 minutes.  I reviewed her home readings today which range from 142/73 up to 187/87.  I reviewed potential treatment options at length with her.  She has already been on many of the major classes of antihypertensive medication and has been unable to tolerate them.  She complains of fatigue with low-dose beta-blockade, intolerance to lisinopril, losartan, and amlodipine.  She is willing to try thiazide diuretic and I will prescribe HCTZ 12.5 mg daily.  She is willing to stay on carvedilol 3.125 mg twice daily.  She understands that after we get beyond the 4 major classes of antihypertensive medications, medications such as clonidine and hydralazine are generally less well-tolerated.  I am going to rate arrange follow-up in our Pharm.D. hypertension clinic.   I would like her to have a metabolic panel in 2 weeks and she is going to see Dr. Henrene [name] for labs at that time. Exam and EKG are unremarkable.  Recommend 2D echocardiogram.  Will be good to evaluate for LVH and diastolic dysfunction in the setting of her hypertension.           Medication Adjustments/Labs and Tests Ordered: Current medicines are reviewed at length with the patient today.  Concerns regarding medicines are outlined above.  Orders Placed This Encounter  Procedures   AMB Referral to Heartcare Pharm-D   EKG 12-Lead   ECHOCARDIOGRAM COMPLETE   Meds ordered this encounter  Medications   hydrochlorothiazide (  MICROZIDE) 12.5 MG capsule    Sig: Take 1 capsule (12.5 mg total) by mouth daily.    Dispense:  30 capsule    Refill:  11    Patient Instructions  Medication Instructions:  START Hydrochlorothiazide 12.5mg  daily *If you need a refill on your cardiac medications before your next appointment, please call your pharmacy*  Lab Work: BMET by Dr Marina Goodell to check kidney function If you have labs (blood work) drawn today and your tests are completely normal, you will receive your results only by: MyChart Message (if you have MyChart) OR A paper copy in the mail If you have any lab test that is abnormal or we need to change your treatment, we will call you to review the results.  Testing/Procedures: Ambulatory Referral to PharmD (HTN clinic)  ECHO Your physician has requested that you have an echocardiogram. Echocardiography is a painless test that uses sound waves to create images of your heart. It provides your doctor with information about the size and shape of your heart and how well your heart's chambers and valves are working. This procedure takes approximately one hour. There are no restrictions for this procedure. Please do NOT wear cologne, perfume, aftershave, or lotions (deodorant is allowed). Please arrive 15 minutes prior to your appointment  time.  Follow-Up: At [facility], you and your health needs are our priority.  As part of our continuing mission to provide you with exceptional heart care, we have created designated Provider Care Teams.  These Care Teams include your primary Cardiologist (physician) and Advanced Practice Providers (APPs -  Physician Assistants and Nurse Practitioners) who all work together to provide you with the care you need, when you need it.  Your next appointment:   1 year(s)  The format for your next appointment:   In Person  Provider:  Tonny Bollman, MD or APP     Important Information About Sugar         Signed, Tonny Bollman, MD  11/18/2021 5:08 PM    [facility]

## 2021-11-18 NOTE — Patient Instructions (Signed)
Medication Instructions:  START Hydrochlorothiazide 12.5mg  daily *If you need a refill on your cardiac medications before your next appointment, please call your pharmacy*  Lab Work: BMET by Dr Henrene Pastor to check kidney function If you have labs (blood work) drawn today and your tests are completely normal, you will receive your results only by: [place] (if you have MyChart) OR A paper copy in the mail If you have any lab test that is abnormal or we need to change your treatment, we will call you to review the results.  Testing/Procedures: Ambulatory Referral to PharmD (HTN clinic)  ECHO Your physician has requested that you have an echocardiogram. Echocardiography is a painless test that uses sound waves to create images of your heart. It provides your doctor with information about the size and shape of your heart and how well your heart's chambers and valves are working. This procedure takes approximately one hour. There are no restrictions for this procedure. Please do NOT wear cologne, perfume, aftershave, or lotions (deodorant is allowed). Please arrive 15 minutes prior to your appointment time.  Follow-Up: At [facility], you and your health needs are our priority.  As part of our continuing mission to provide you with exceptional heart care, we have created designated Provider Care Teams.  These Care Teams include your primary Cardiologist (physician) and Advanced Practice Providers (APPs -  Physician Assistants and Nurse Practitioners) who all work together to provide you with the care you need, when you need it.  Your next appointment:   1 year(s)  The format for your next appointment:   In Person  Provider:  Sherren Mocha, MD or APP     Important Information About Sugar

## 2021-11-30 ENCOUNTER — Encounter: Payer: Self-pay | Admitting: Legal Medicine

## 2021-11-30 ENCOUNTER — Ambulatory Visit: Payer: Medicare PPO | Admitting: Legal Medicine

## 2021-11-30 VITALS — BP 126/78 | HR 72 | Temp 98.9°F | Resp 14 | Ht 67.0 in | Wt 157.0 lb

## 2021-11-30 DIAGNOSIS — I10 Essential (primary) hypertension: Secondary | ICD-10-CM

## 2021-11-30 DIAGNOSIS — H6121 Impacted cerumen, right ear: Secondary | ICD-10-CM

## 2021-11-30 DIAGNOSIS — Z23 Encounter for immunization: Secondary | ICD-10-CM

## 2021-11-30 DIAGNOSIS — Z1159 Encounter for screening for other viral diseases: Secondary | ICD-10-CM

## 2021-11-30 DIAGNOSIS — E782 Mixed hyperlipidemia: Secondary | ICD-10-CM | POA: Diagnosis not present

## 2021-11-30 DIAGNOSIS — H6120 Impacted cerumen, unspecified ear: Secondary | ICD-10-CM | POA: Insufficient documentation

## 2021-11-30 NOTE — Progress Notes (Unsigned)
Patient ID: [name] [name]                 DOB: 1945-03-08                      MRN: [id number]      HPI: [name] [name] is a 76 y.o. female referred by Dr. Copper to HTN clinic. PMH is significant for   Current HTN meds:  Previously tried:  BP goal:   Family History:   Social History:   Diet:   Exercise:  {types:28256}  Home BP readings:  Date SBP/DBP  HR              Average      Wt Readings from Last 3 Encounters:  11/30/21 157 lb (71.2 kg)  11/18/21 158 lb 12.8 oz (72 kg)  04/26/21 161 lb (73 kg)   BP Readings from Last 3 Encounters:  11/30/21 126/78  11/18/21 (!) 170/98  04/26/21 136/78   Pulse Readings from Last 3 Encounters:  11/30/21 72  11/18/21 66  04/26/21 78    Renal function: CrCl cannot be calculated (Patient's most recent lab result is older than the maximum 21 days allowed.).  Past Medical History:  Diagnosis Date   Allergic rhinitis due to pollen    CONSTIPATION    Essential hypertension 09/21/2008   Qualifier: Diagnosis of  By: Kem Parkinson     GERD    MITRAL VALVE PROLAPSE    Nonrheumatic mitral (valve) prolapse    PREMATURE ATRIAL CONTRACTIONS    Solitary pulmonary nodule     Current Outpatient Medications on File Prior to Visit  Medication Sig Dispense Refill   carvedilol (COREG) 3.125 MG tablet Take 1 tablet (3.125 mg total) by mouth 2 (two) times daily. 60 tablet 4   hydrochlorothiazide (MICROZIDE) 12.5 MG capsule Take 1 capsule (12.5 mg total) by mouth daily. 30 capsule 11   No current facility-administered medications on file prior to visit.    Allergies  Allergen Reactions   Clindamycin    Levaquin [Levofloxacin] Itching   Lisinopril Hives   Tru-Micin [Misc Natural Products] Other (See Comments)   Erythromycin Swelling and Rash    There were no vitals taken for this visit.   Assessment/Plan:  1. Hypertension -  No problem-specific Assessment & Plan notes found for this  encounter.  @MTPCOMPLETEDLIST @  {f/u with PharmD:28259}  Thank you  , Pharm.D [facility] of [facility] [address] [place], [place]  Phone: [phone]; Fax: [phone]

## 2021-11-30 NOTE — Progress Notes (Signed)
Acute Office Visit  Subjective:    Patient ID: [name] [name], female    DOB: October 11, 1945, 76 y.o.   MRN: [id number]  Chief Complaint  Patient presents with   Hypertension    HPI: Patient is in today for high blood pressure issues. She has been checking her blood pressure at home and systolic are 888-280 and diastolic 03-49.  She went to see cardiologist and they added HCTZ 12.5 mg daily., still on coreg  Past Medical History:  Diagnosis Date   Allergic rhinitis due to pollen    CONSTIPATION    Essential hypertension 09/21/2008   Qualifier: Diagnosis of  By: Burnett Kanaris     GERD    MITRAL VALVE PROLAPSE    Nonrheumatic mitral (valve) prolapse    PREMATURE ATRIAL CONTRACTIONS    Solitary pulmonary nodule     Past Surgical History:  Procedure Laterality Date   BACK SURGERY     LAPAROSCOPIC HYSTERECTOMY      Family History  Problem Relation Age of Onset   Heart disease Other    Hypertension Mother    Parkinson's disease Father     Social History   Socioeconomic History   Marital status: Married    Spouse name: Not on file   Number of children: Not on file   Years of education: Not on file   Highest education level: Not on file  Occupational History   Not on file  Tobacco Use   Smoking status: Never   Smokeless tobacco: Never  Substance and Sexual Activity   Alcohol use: No   Drug use: Never   Sexual activity: Not Currently  Other Topics Concern   Not on file  Social History Narrative   Not on file   Social Determinants of Health   Financial Resource Strain: Not on file  Food Insecurity: Not on file  Transportation Needs: Not on file  Physical Activity: Not on file  Stress: Not on file  Social Connections: Not on file  Intimate Partner Violence: Not on file    Outpatient Medications Prior to Visit  Medication Sig Dispense Refill   carvedilol (COREG) 3.125 MG tablet Take 1 tablet (3.125 mg total) by mouth 2 (two) times daily. 60 tablet 4    hydrochlorothiazide (MICROZIDE) 12.5 MG capsule Take 1 capsule (12.5 mg total) by mouth daily. 30 capsule 11   ketorolac (ACULAR) 0.5 % ophthalmic solution Place into the left eye 4 (four) times daily. Taking in left eye     prednisoLONE acetate (PRED FORTE) 1 % ophthalmic suspension Per patient taking     No facility-administered medications prior to visit.    Allergies  Allergen Reactions   Clindamycin    Levaquin [Levofloxacin] Itching   Lisinopril Hives   Tru-Micin [Misc Natural Products] Other (See Comments)   Erythromycin Swelling and Rash    Review of Systems  Constitutional:  Negative for chills, fatigue and fever.  HENT:  Negative for congestion, ear pain and sore throat.   Respiratory:  Negative for cough and shortness of breath.   Cardiovascular:  Negative for chest pain and palpitations.  Gastrointestinal:  Negative for abdominal pain, constipation, diarrhea, nausea and vomiting.  Endocrine: Negative for polydipsia, polyphagia and polyuria.  Genitourinary:  Negative for difficulty urinating and dysuria.  Musculoskeletal:  Negative for arthralgias, back pain and myalgias.  Skin:  Negative for rash.  Neurological:  Negative for headaches.  Psychiatric/Behavioral:  Negative for dysphoric mood. The patient is not nervous/anxious.  Objective:    Physical Exam Vitals reviewed.  Constitutional:      General: She is not in acute distress.    Appearance: Normal appearance.  HENT:     Head: Normocephalic.     Right Ear: Tympanic membrane normal.     Left Ear: Tympanic membrane normal.     Ears:     Comments: Cerumen right ear    Nose: Nose normal.     Mouth/Throat:     Mouth: Mucous membranes are moist.     Pharynx: Oropharynx is clear.  Eyes:     Extraocular Movements: Extraocular movements intact.     Conjunctiva/sclera: Conjunctivae normal.     Pupils: Pupils are equal, round, and reactive to light.  Cardiovascular:     Rate and Rhythm: Normal rate and  regular rhythm.     Pulses: Normal pulses.     Heart sounds: Normal heart sounds. No murmur heard.    No gallop.  Pulmonary:     Effort: Pulmonary effort is normal. No respiratory distress.     Breath sounds: Normal breath sounds. No wheezing.  Abdominal:     General: Abdomen is flat. Bowel sounds are normal. There is no distension.     Tenderness: There is no abdominal tenderness.  Musculoskeletal:        General: Normal range of motion.     Cervical back: Normal range of motion and neck supple.  Skin:    General: Skin is warm.     Capillary Refill: Capillary refill takes less than 2 seconds.  Neurological:     General: No focal deficit present.     Mental Status: She is alert and oriented to person, place, and time. Mental status is at baseline.  Psychiatric:        Mood and Affect: Mood normal.        Thought Content: Thought content normal.        Judgment: Judgment normal.     BP 126/78   Pulse 72   Temp 98.9 F (37.2 C)   Resp 14   Ht _0  (1.702 m)   Wt 157 lb (71.2 kg)   SpO2 96%   BMI 24.59 kg/m  Wt Readings from Last 3 Encounters:  11/30/21 157 lb (71.2 kg)  11/18/21 158 lb 12.8 oz (72 kg)  04/26/21 161 lb (73 kg)    Health Maintenance Due  Topic Date Due   Medicare Annual Wellness (AWV)  Never done   Hepatitis C Screening  Never done   TETANUS/TDAP  Never done   Zoster Vaccines- Shingrix (1 of 2) Never done   COVID-19 Vaccine (4 - Pfizer series) 12/25/2019   INFLUENZA VACCINE  08/23/2021    There are no preventive care reminders to display for this patient.   Lab Results  Component Value Date   TSH 1.290 02/02/2016   Lab Results  Component Value Date   WBC 10.0 04/11/2021   HGB 13.4 04/11/2021   HCT 38.3 04/11/2021   MCV 87 04/11/2021   PLT 248 04/11/2021   Lab Results  Component Value Date   NA 143 04/11/2021   K 4.3 04/11/2021   CO2 24 04/11/2021   GLUCOSE 88 04/11/2021   BUN 21 04/11/2021   CREATININE 0.90 04/11/2021   BILITOT  0.3 04/11/2021   ALKPHOS 74 04/11/2021   AST 32 04/11/2021   ALT 18 04/11/2021   PROT 6.7 04/11/2021   ALBUMIN 4.5 04/11/2021   CALCIUM 9.7 04/11/2021  ANIONGAP 10 08/03/2019   EGFR 66 04/11/2021   Lab Results  Component Value Date   CHOL 251 (H) 04/11/2021   Lab Results  Component Value Date   HDL 68 04/11/2021   Lab Results  Component Value Date   LDLCALC 168 (H) 04/11/2021   Lab Results  Component Value Date   TRIG 88 04/11/2021   Lab Results  Component Value Date   CHOLHDL 3.7 04/11/2021   Lab Results  Component Value Date   HGBA1C 5.5 02/02/2016       Assessment & Plan:   Problem List Items Addressed This Visit       Cardiovascular and Mediastinum   Essential hypertension - Primary   Relevant Orders   CBC with Differential/Platelet   Comprehensive metabolic panel BP in good control on present medicines     Nervous and Auditory   Cerumen impaction Right ear lavaged clear   Other Visit Diagnoses     Need for hepatitis C screening test       Relevant Orders   Hepatitis C Antibody                                                                                                                                                                                                                                                                                                                                                   Orders Placed This Encounter  Procedures   CBC with Differential/Platelet   Comprehensive metabolic panel   Hepatitis C Antibody     Follow-up: Return in about 6 months (around 05/31/2022), or set up AWV.  An After Visit Summary was printed and given to the patient.  Reinaldo Meeker, MD [facility] [phone]

## 2021-12-01 ENCOUNTER — Ambulatory Visit: Payer: Medicare PPO | Admitting: Pharmacist

## 2021-12-01 ENCOUNTER — Ambulatory Visit: Payer: Medicare PPO | Attending: Cardiology

## 2021-12-01 VITALS — BP 160/82

## 2021-12-01 DIAGNOSIS — E785 Hyperlipidemia, unspecified: Secondary | ICD-10-CM | POA: Insufficient documentation

## 2021-12-01 DIAGNOSIS — I10 Essential (primary) hypertension: Secondary | ICD-10-CM | POA: Diagnosis not present

## 2021-12-01 DIAGNOSIS — R0609 Other forms of dyspnea: Secondary | ICD-10-CM | POA: Diagnosis not present

## 2021-12-01 LAB — COMPREHENSIVE METABOLIC PANEL
ALT: 12 IU/L (ref 0–32)
AST: 18 IU/L (ref 0–40)
Albumin/Globulin Ratio: 1.9 (ref 1.2–2.2)
Albumin: 4.3 g/dL (ref 3.8–4.8)
Alkaline Phosphatase: 68 IU/L (ref 44–121)
BUN/Creatinine Ratio: 25 (ref 12–28)
BUN: 30 mg/dL — ABNORMAL HIGH (ref 8–27)
Bilirubin Total: 0.4 mg/dL (ref 0.0–1.2)
CO2: 23 mmol/L (ref 20–29)
Calcium: 9.4 mg/dL (ref 8.7–10.3)
Chloride: 104 mmol/L (ref 96–106)
Creatinine, Ser: 1.21 mg/dL — ABNORMAL HIGH (ref 0.57–1.00)
Globulin, Total: 2.3 g/dL (ref 1.5–4.5)
Glucose: 92 mg/dL (ref 70–99)
Potassium: 4.2 mmol/L (ref 3.5–5.2)
Sodium: 143 mmol/L (ref 134–144)
Total Protein: 6.6 g/dL (ref 6.0–8.5)
eGFR: 46 mL/min/{1.73_m2} — ABNORMAL LOW (ref >59)

## 2021-12-01 LAB — ECHOCARDIOGRAM COMPLETE
Area-P 1/2: 3.08 cm2
S' Lateral: 2.3 cm

## 2021-12-01 LAB — CBC WITH DIFFERENTIAL/PLATELET
Basophils Absolute: 0.1 10*3/uL (ref 0.0–0.2)
Basos: 1 %
EOS (ABSOLUTE): 0.2 10*3/uL (ref 0.0–0.4)
Eos: 3 %
Hematocrit: 38.6 % (ref 34.0–46.6)
Hemoglobin: 13 g/dL (ref 11.1–15.9)
Immature Grans (Abs): 0 10*3/uL (ref 0.0–0.1)
Immature Granulocytes: 0 %
Lymphocytes Absolute: 1.7 10*3/uL (ref 0.7–3.1)
Lymphs: 21 %
MCH: 31 pg (ref 26.6–33.0)
MCHC: 33.7 g/dL (ref 31.5–35.7)
MCV: 92 fL (ref 79–97)
Monocytes Absolute: 0.7 10*3/uL (ref 0.1–0.9)
Monocytes: 8 %
Neutrophils Absolute: 5.4 10*3/uL (ref 1.4–7.0)
Neutrophils: 67 %
Platelets: 242 10*3/uL (ref 150–450)
RBC: 4.2 x10E6/uL (ref 3.77–5.28)
RDW: 12.1 % (ref 11.7–15.4)
WBC: 8.1 10*3/uL (ref 3.4–10.8)

## 2021-12-01 LAB — LIPID PANEL
Chol/HDL Ratio: 4.6 ratio — ABNORMAL HIGH (ref 0.0–4.4)
Cholesterol, Total: 239 mg/dL — ABNORMAL HIGH (ref 100–199)
HDL: 52 mg/dL (ref >39)
LDL Chol Calc (NIH): 165 mg/dL — ABNORMAL HIGH (ref 0–99)
Triglycerides: 124 mg/dL (ref 0–149)
VLDL Cholesterol Cal: 22 mg/dL (ref 5–40)

## 2021-12-01 LAB — CARDIOVASCULAR RISK ASSESSMENT

## 2021-12-01 LAB — HEPATITIS C ANTIBODY: Hep C Virus Ab: NONREACTIVE

## 2021-12-01 NOTE — Assessment & Plan Note (Addendum)
Assessment: Home blood pressures above goal of <130/80; did not bring in home cuff today to assess In office blood pressure above goal at 160/82 (has history of white coat HTN) Adherent with medications and does not miss doses; has no side effects from HCTZ and body has adjusted to fatigue from coreg Has history of intolerances to medications like losartan and amlodipine Tends to eat out few days per week, but is mindful  and watches what she eats Has not exercised since prior to COVID  Plan Continue home medications of coreg 3.125mg  BID and HCTZ 12.5 mg QD Assess tolerances to losartan and amlodipine Discussed proper technique when taking blood pressure such as feet flat on floor, rest for 5-10 minutes, emptied bladder, and arm at heart level. Continue to work on diet and limit bread, red meats, add more fiber through fruits and vegetables, and limit eating out. Add back exercise into routine Plan to follow up within 6 weeks and bring blood pressure cuff and home bp readings

## 2021-12-01 NOTE — Progress Notes (Addendum)
Patient ID: [name]                 DOB: 05-28-1945                      MRN: [id number]      HPI: [name] [name] is a 76 y.o. female referred by Dr. Burt Knack to HTN clinic. PMH is significant for hypertension, history of white coat hypertension mitral valve prolapse, and heart palpitations.  On 11/18/2021, patient was having more trouble with her blood pressure with elevated readings. She is having fatigue and shortness of breath with activity. She does not like taking carvedilol, but has remained compliant, but feels cause of fatigue. Dr. Burt Knack added HCTZ 12.5 mg to her regimen on 10/27.  11/8: Had appointment with Dr. Henrene Pastor with home blood pressures ranging in 387-564 systolic and 33-29 diastolic. In office bp was 126/78 and labs were done since starting HCTZ 12.5 mg  Current HTN meds: carvedilol 3.125 mg BID + HCTZ 12.5 mg QD Previously tried: metoprolol, lisinopril, losartan, amlodipine (jittery) BP goal: <130/80  Family History:  Family History  Problem Relation Age of Onset   Heart disease Other    Hypertension Mother    Parkinson's disease Father      Social History:  reports that she has never smoked. She has never used smokeless tobacco. She reports that she does not drink alcohol and does not use drugs.   Diet: Tries to be mindful of what she is eating. Eats from th garden in the summer time. Admits to enjoying bread and butter, but knows to limit those foods. She eats mostly chicken and some red meat, does not put salt on food.   Exercise: Has not exercised prior to COVID. 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  Home BP readings:  136/72 140/78 139/73 139/78  Office BP readings: 160/82, HR 69  Wt Readings from Last 3 Encounters:  11/30/21 157 lb (71.2 kg)  11/18/21 158 lb 12.8 oz (72 kg)  04/26/21 161 lb (73 kg)   BP Readings from Last 3 Encounters:  12/01/21 (!) 160/82  11/30/21 126/78  11/18/21 (!) 170/98   Pulse  Readings from Last 3 Encounters:  11/30/21 72  11/18/21 66  04/26/21 78    Renal function: Estimated Creatinine Clearance: 37.3 mL/min (A) (by C-G formula based on SCr of 1.21 mg/dL (H)).  Past Medical History:  Diagnosis Date   Allergic rhinitis due to pollen    CONSTIPATION    Essential hypertension 09/21/2008   Qualifier: Diagnosis of  By: Burnett Kanaris     GERD    MITRAL VALVE PROLAPSE    Nonrheumatic mitral (valve) prolapse    PREMATURE ATRIAL CONTRACTIONS    Solitary pulmonary nodule     Current Outpatient Medications on File Prior to Visit  Medication Sig Dispense Refill   carvedilol (COREG) 3.125 MG tablet Take 1 tablet (3.125 mg total) by mouth 2 (two) times daily. 60 tablet 4   hydrochlorothiazide (MICROZIDE) 12.5 MG capsule Take 1 capsule (12.5 mg total) by mouth daily. 30 capsule 11   No current facility-administered medications on file prior to visit.   BMET    Component Value Date/Time   NA 143 11/30/2021 1015   K 4.2 11/30/2021 1015   CL 104 11/30/2021 1015   CO2 23 11/30/2021 1015   GLUCOSE 92 11/30/2021 1015   GLUCOSE 107 (H) 08/03/2019 1050  BUN 30 (H) 11/30/2021 1015   CREATININE 1.21 (H) 11/30/2021 1015   CALCIUM 9.4 11/30/2021 1015   EGFR 46 (L) 11/30/2021 1015   GFRNONAA 52 (L) 03/11/2020 0900    Allergies  Allergen Reactions   Clindamycin    Levaquin [Levofloxacin] Itching   Lisinopril Hives   Tru-Micin [Misc Natural Products] Other (See Comments)   Erythromycin Swelling and Rash     Assessment/Plan: Essential hypertension Assessment: Home blood pressures above goal of <130/80; did not bring in home cuff today to assess In office blood pressure above goal at 160/82 (has history of white coat HTN) Adherent with medications and does not miss doses; has no side effects from HCTZ and body has adjusted to fatigue from coreg Has history of intolerances to medications like losartan and amlodipine Tends to eat out few days per week, but  is mindful  and watches what she eats Has not exercised since prior to [place] home medications of coreg 3.129m BID and HCTZ 12.5 mg QD Assess tolerances to losartan and amlodipine Discussed proper technique when taking blood pressure such as feet flat on floor, rest for 5-10 minutes, emptied bladder, and arm at heart level. Continue to work on diet and limit bread, red meats, add more fiber through fruits and vegetables, and limit eating out. Add back exercise into routine Plan to follow up within 6 weeks and bring blood pressure cuff and home bp readings  Hyperlipidemia Assessment: LDL-C is 165 (previously 168 in 03/2021) Patient does not want to start a statin due to side effect profile- has family that had reactions to medication Wants to focus on fixing her diet and starting exercise  Plan Discussed the risk vs benefits of a statin at her age and overall benefit she would see with a statin (5 years for total benefit) Will hold off on starting a statin at this time Continue to work on diet: limiting red meats, adding fiber through fruits and vegetables, and less eating out Follow up time to set up a fasting lipid panel to re-assess   Thank you  JSandford Craze PharmD. [facility] PGY-1 12/01/2021 3:08 PM   MRamond Dial Pharm.D, BCPS, CPP [facility] [place] [address] Phone: [phone] Fax: [phone]

## 2021-12-01 NOTE — Assessment & Plan Note (Signed)
Assessment: LDL-C is 165 (previously 168 in 03/2021) Patient does not want to start a statin due to side effect profile- has family that had reactions to medication Wants to focus on fixing her diet and starting exercise  Plan Discussed the risk vs benefits of a statin at her age and overall benefit she would see with a statin (5 years for total benefit) Will hold off on starting a statin at this time Continue to work on diet: limiting red meats, adding fiber through fruits and vegetables, and less eating out Follow up time to set up a fasting lipid panel to re-assess

## 2021-12-01 NOTE — Progress Notes (Signed)
LDL cholesterol high at 165 strongly suggest a satin to lower cardiovascular disease, also DASH diet, kidney tests stage 3a, lower than 7 months ago, liver tests normal, CBC normal Hepatitis c negative lp

## 2021-12-01 NOTE — Patient Instructions (Addendum)
Summary of today's discussion  1.Continue carvedilol 3.125mg  twice a day and hydrochlorothiazide 12.5mg  daily  2.Please bring your blood pressure machine and readings with you to your next appointment  3. Start walking on a regular basis. Start with 10 min once or twice a day and increase as able  4.  5.   Your blood pressure goal is <130/80  To check your pressure at home you will need to:  1. Sit up in a chair, with feet flat on the floor and back supported. Do not cross your ankles or legs. 2. Rest your left arm so that the cuff is about heart level. If the cuff goes on your upper arm,  then just relax the arm on the table, arm of the chair or your lap. If you have a wrist cuff, we  suggest relaxing your wrist against your chest (think of it as Pledging the Flag with the  wrong arm).  3. Place the cuff snugly around your arm, about 1 inch above the crook of your elbow. The  cords should be inside the groove of your elbow.  4. Sit quietly, with the cuff in place, for about 5 minutes. After that 5 minutes press the power  button to start a reading. 5. Do not talk or move while the reading is taking place.  6. Record your readings on a sheet of paper. Although most cuffs have a memory, it is often  easier to see a pattern developing when the numbers are all in front of you.  7. You can repeat the reading after 1-3 minutes if it is recommended  Make sure your bladder is empty and you have not had caffeine or tobacco within the last 30 min  Always bring your blood pressure log with you to your appointments. If you have not brought your monitor in to be double checked for accuracy, please bring it to your next appointment.  You can find a list of validated (accurate) blood pressure cuffs at [email or web address]   Important lifestyle changes to control high blood pressure  Intervention  Effect on the BP  Lose extra pounds and watch your waistline Weight loss is one of the most effective  lifestyle changes for controlling blood pressure. If you're overweight or obese, losing even a small amount of weight can help reduce blood pressure. Blood pressure might go down by about 1 millimeter of mercury (mm Hg) with each kilogram (about 2.2 pounds) of weight lost.  Exercise regularly As a general goal, aim for at least 30 minutes of moderate physical activity every day. Regular physical activity can lower high blood pressure by about 5 to 8 mm Hg.  Eat a healthy diet Eating a diet rich in whole grains, fruits, vegetables, and low-fat dairy products and low in saturated fat and cholesterol. A healthy diet can lower high blood pressure by up to 11 mm Hg.  Reduce salt (sodium) in your diet Even a small reduction of sodium in the diet can improve heart health and reduce high blood pressure by about 5 to 6 mm Hg.  Limit alcohol One drink equals 12 ounces of beer, 5 ounces of wine, or 1.5 ounces of 80-proof liquor.  Limiting alcohol to less than one drink a day for women or two drinks a day for men can help lower blood pressure by about 4 mm Hg.   Please call me at [phone] with any questions.

## 2021-12-02 DIAGNOSIS — R229 Localized swelling, mass and lump, unspecified: Secondary | ICD-10-CM | POA: Diagnosis not present

## 2021-12-02 DIAGNOSIS — C44622 Squamous cell carcinoma of skin of right upper limb, including shoulder: Secondary | ICD-10-CM | POA: Diagnosis not present

## 2021-12-02 DIAGNOSIS — R208 Other disturbances of skin sensation: Secondary | ICD-10-CM | POA: Diagnosis not present

## 2021-12-02 DIAGNOSIS — D1801 Hemangioma of skin and subcutaneous tissue: Secondary | ICD-10-CM | POA: Diagnosis not present

## 2021-12-02 DIAGNOSIS — L820 Inflamed seborrheic keratosis: Secondary | ICD-10-CM | POA: Diagnosis not present

## 2021-12-02 DIAGNOSIS — L821 Other seborrheic keratosis: Secondary | ICD-10-CM | POA: Diagnosis not present

## 2021-12-02 DIAGNOSIS — Z08 Encounter for follow-up examination after completed treatment for malignant neoplasm: Secondary | ICD-10-CM | POA: Diagnosis not present

## 2021-12-02 DIAGNOSIS — Z85828 Personal history of other malignant neoplasm of skin: Secondary | ICD-10-CM | POA: Diagnosis not present

## 2021-12-02 DIAGNOSIS — L814 Other melanin hyperpigmentation: Secondary | ICD-10-CM | POA: Diagnosis not present

## 2022-01-12 ENCOUNTER — Ambulatory Visit: Payer: Medicare PPO

## 2022-01-23 HISTORY — PX: CATARACT EXTRACTION, BILATERAL: SHX1313

## 2022-01-26 ENCOUNTER — Telehealth: Payer: Medicare PPO | Admitting: Family Medicine

## 2022-02-21 NOTE — Progress Notes (Signed)
Patient ID: SHADAI MCCLANE                 DOB: February 21, 1945                      MRN: [id number]      HPI: [name] [name] is a 77 y.o. female referred by Dr. Burt Knack to HTN clinic. PMH is significant for hypertension, history of white coat hypertension mitral valve prolapse, and heart palpitations.  On 11/18/2021, patient was having more trouble with her blood pressure with elevated readings. She is having fatigue and shortness of breath with activity. She does not like taking carvedilol, but has remained compliant, but feels cause of fatigue. Dr. Burt Knack added HCTZ 12.5 mg to her regimen on 10/27.  11/8: Had appointment with Dr. Henrene Pastor with home blood pressures ranging in 824-235 systolic and 36-14 diastolic. In office bp was 126/78 and labs were done since starting HCTZ 12.5 mg  Current HTN meds: carvedilol 3.125 mg BID + HCTZ 12.5 mg QD Previously tried: metoprolol, lisinopril- Hives  losartan, amlodipine (jittery) BP goal: <130/80  Family History:  Family History  Problem Relation Age of Onset   Heart disease Other    Hypertension Mother    Parkinson's disease Father      Social History:  reports that she has never smoked. She has never used smokeless tobacco. She reports that she does not drink alcohol and does not use drugs.   Diet: Tries to be mindful of what she is eating. Eats from th garden in the summer time. Admits to enjoying bread and butter, but knows to limit those foods. She eats mostly chicken and some red meat, does not put salt on food.   Exercise: Has not exercised prior to COVID. 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  Home BP readings:  136/72 140/78 139/73 139/78  Office BP readings: 160/82, HR 69  Wt Readings from Last 3 Encounters:  11/30/21 157 lb (71.2 kg)  11/18/21 158 lb 12.8 oz (72 kg)  04/26/21 161 lb (73 kg)   BP Readings from Last 3 Encounters:  12/01/21 (!) 160/82  11/30/21 126/78  11/18/21 (!) 170/98    Pulse Readings from Last 3 Encounters:  11/30/21 72  11/18/21 66  04/26/21 78    Renal function: Estimated Creatinine Clearance: 37.3 mL/min (A) (by C-G formula based on SCr of 1.21 mg/dL (H)).  Past Medical History:  Diagnosis Date   Allergic rhinitis due to pollen    CONSTIPATION    Essential hypertension 09/21/2008   Qualifier: Diagnosis of  By: Burnett Kanaris     GERD    MITRAL VALVE PROLAPSE    Nonrheumatic mitral (valve) prolapse    PREMATURE ATRIAL CONTRACTIONS    Solitary pulmonary nodule     Current Outpatient Medications on File Prior to Visit  Medication Sig Dispense Refill   carvedilol (COREG) 3.125 MG tablet Take 1 tablet (3.125 mg total) by mouth 2 (two) times daily. 60 tablet 4   hydrochlorothiazide (MICROZIDE) 12.5 MG capsule Take 1 capsule (12.5 mg total) by mouth daily. 30 capsule 11   No current facility-administered medications on file prior to visit.   BMET    Component Value Date/Time   NA 143 11/30/2021 1015   K 4.2 11/30/2021 1015   CL 104 11/30/2021 1015   CO2 23 11/30/2021 1015   GLUCOSE 92 11/30/2021 1015   GLUCOSE 107 (H) 08/03/2019  1050   BUN 30 (H) 11/30/2021 1015   CREATININE 1.21 (H) 11/30/2021 1015   CALCIUM 9.4 11/30/2021 1015   EGFR 46 (L) 11/30/2021 1015   GFRNONAA 52 (L) 03/11/2020 0900    Allergies  Allergen Reactions   Clindamycin    Levaquin [Levofloxacin] Itching   Lisinopril Hives   Tru-Micin [Misc Natural Products] Other (See Comments)   Erythromycin Swelling and Rash     Assessment/Plan: Essential hypertension Assessment: Home blood pressures above goal of <130/80; did not bring in home cuff today to assess In office blood pressure above goal at 160/82 (has history of white coat HTN) Adherent with medications and does not miss doses; has no side effects from HCTZ and body has adjusted to fatigue from coreg Has history of intolerances to medications like losartan and amlodipine Tends to eat out few days per  week, but is mindful  and watches what she eats Has not exercised since prior to [place] home medications of coreg 3.125mg  BID and HCTZ 12.5 mg QD Assess tolerances to losartan and amlodipine Discussed proper technique when taking blood pressure such as feet flat on floor, rest for 5-10 minutes, emptied bladder, and arm at heart level. Continue to work on diet and limit bread, red meats, add more fiber through fruits and vegetables, and limit eating out. Add back exercise into routine Plan to follow up within 6 weeks and bring blood pressure cuff and home bp readings  Hyperlipidemia Assessment: LDL-C is 165 (previously 168 in 03/2021) Patient does not want to start a statin due to side effect profile- has family that had reactions to medication Wants to focus on fixing her diet and starting exercise  Plan Discussed the risk vs benefits of a statin at her age and overall benefit she would see with a statin (5 years for total benefit) Will hold off on starting a statin at this time Continue to work on diet: limiting red meats, adding fiber through fruits and vegetables, and less eating out Follow up time to set up a fasting lipid panel to re-assess   Thank you

## 2022-02-22 ENCOUNTER — Ambulatory Visit: Payer: Medicare PPO | Attending: Cardiology | Admitting: Student

## 2022-02-22 DIAGNOSIS — I10 Essential (primary) hypertension: Secondary | ICD-10-CM

## 2022-02-22 NOTE — Assessment & Plan Note (Signed)
Assessment: BP is uncontrolled (goal <130/80) 1st measurement in the office  140/ 80 mmHg which went down to 138/70 on 2nd measurement Patient reports she has white coat syndrome, home BP ~135/70 heart rate 65 Follows correct steps for at home BP measurement but upon validation of home cuff found out that it is inaccurate (reads ~20 points more than manual) Takes current BP medications regularly and tolerates them well without any side effects Denies SOB, palpitation, chest pain, headaches,or swelling Reiterated the importance of regular exercise and low salt diet   Plan:  Continue taking carvedilol 3.125 mg BID & HCTZ 12.5 mg QD Patient to buy new BP monitor and  to keep record of BP readings with heart rate and report to [place] at the next visit Bring in the new cuff for validation at the next visit  Patient to see PharmD in 5 weeks for follow up  Follow up lab(s): none

## 2022-02-22 NOTE — Patient Instructions (Signed)
Changes made by your pharmacist Cammy Copa, PharmD at today's visit:    Instructions/Changes  (what do you need to do) Your Notes  (what you did and when you did it)  Continue taking current medications    2.   Start walking 30 min every day   3.   Lower the salt intake ( e.g. salt free nuts instead of salty nuts for snack)    Bring all of your meds, your BP cuff and your record of home blood pressures to your next appointment.    HOW TO TAKE YOUR BLOOD PRESSURE AT HOME  Rest 5 minutes before taking your blood pressure.  Don't smoke or drink caffeinated beverages for at least 30 minutes before. Take your blood pressure before (not after) you eat. Sit comfortably with your back supported and both feet on the floor (don't cross your legs). Elevate your arm to heart level on a table or a desk. Use the proper sized cuff. It should fit smoothly and snugly around your bare upper arm. There should be enough room to slip a fingertip under the cuff. The bottom edge of the cuff should be 1 inch above the crease of the elbow. Ideally, take 3 measurements at one sitting and record the average.  Important lifestyle changes to control high blood pressure  Intervention  Effect on the BP  Lose extra pounds and watch your waistline Weight loss is one of the most effective lifestyle changes for controlling blood pressure. If you're overweight or obese, losing even a small amount of weight can help reduce blood pressure. Blood pressure might go down by about 1 millimeter of mercury (mm Hg) with each kilogram (about 2.2 pounds) of weight lost.  Exercise regularly As a general goal, aim for at least 30 minutes of moderate physical activity every day. Regular physical activity can lower high blood pressure by about 5 to 8 mm Hg.  Eat a healthy diet Eating a diet rich in whole grains, fruits, vegetables, and low-fat dairy products and low in saturated fat and cholesterol. A healthy diet can lower high  blood pressure by up to 11 mm Hg.  Reduce salt (sodium) in your diet Even a small reduction of sodium in the diet can improve heart health and reduce high blood pressure by about 5 to 6 mm Hg.  Limit alcohol One drink equals 12 ounces of beer, 5 ounces of wine, or 1.5 ounces of 80-proof liquor.  Limiting alcohol to less than one drink a day for women or two drinks a day for men can help lower blood pressure by about 4 mm Hg.   If you have any questions or concerns please use My Chart to send questions or call the office at [phone]

## 2022-03-13 ENCOUNTER — Ambulatory Visit: Payer: Medicare PPO | Admitting: Cardiovascular Disease

## 2022-03-27 ENCOUNTER — Telehealth: Payer: Self-pay | Admitting: Cardiovascular Disease

## 2022-03-27 DIAGNOSIS — R0609 Other forms of dyspnea: Secondary | ICD-10-CM

## 2022-03-27 DIAGNOSIS — I10 Essential (primary) hypertension: Secondary | ICD-10-CM

## 2022-03-27 MED ORDER — HYDROCHLOROTHIAZIDE 12.5 MG PO CAPS: 12.5000 mg | ORAL_CAPSULE | Freq: Every day | ORAL | 7 refills | Status: DC

## 2022-03-27 MED ORDER — CARVEDILOL 3.125 MG PO TABS: 3.1250 mg | ORAL_TABLET | Freq: Two times a day (BID) | ORAL | 7 refills | Status: DC

## 2022-03-27 NOTE — Telephone Encounter (Signed)
Pt's medication was sent to pt's pharmacy as requested. Confirmation received.  °

## 2022-03-27 NOTE — Telephone Encounter (Signed)
*  STAT* If patient is at the pharmacy, call can be transferred to refill team.   1. Which medications need to be refilled? (please list name of each medication and dose if known)  carvedilol (COREG) 3.125 MG tablet    hydrochlorothiazide (MICROZIDE) 12.5 MG capsule  2. Which pharmacy/location (including street and city if local pharmacy) is medication to be sent to? [facility] - [address] [place], [place] [address]  3. Do they need a 30 day or 90 day supply?  90 day supply   Patient is requesting to have [facility] is [facility].

## 2022-03-30 ENCOUNTER — Other Ambulatory Visit: Payer: Self-pay | Admitting: Cardiovascular Disease

## 2022-03-31 ENCOUNTER — Ambulatory Visit: Payer: Medicare PPO

## 2022-04-13 ENCOUNTER — Other Ambulatory Visit: Payer: Self-pay

## 2022-04-13 MED ORDER — CARVEDILOL 3.125 MG PO TABS: 3.1250 mg | ORAL_TABLET | Freq: Two times a day (BID) | ORAL | 6 refills | Status: DC

## 2022-04-13 NOTE — Telephone Encounter (Signed)
Pt's medication was sent to pt's pharmacy as requested. Confirmation received.  °

## 2022-05-04 ENCOUNTER — Ambulatory Visit: Payer: Medicare PPO | Attending: Cardiovascular Disease | Admitting: Pharmacist

## 2022-05-04 VITALS — BP 174/80 | HR 62

## 2022-05-04 DIAGNOSIS — I10 Essential (primary) hypertension: Secondary | ICD-10-CM | POA: Diagnosis not present

## 2022-05-04 NOTE — Patient Instructions (Signed)
Summary of today's discussion  1.start walking/exercising on a daily basis  2.Check blood pressure several times a week  3. Send me blood pressure readings in 1 month  4.  5.   Your blood pressure goal is <130/80  To check your pressure at home you will need to:  1. Sit up in a chair, with feet flat on the floor and back supported. Do not cross your ankles or legs. 2. Rest your left arm so that the cuff is about heart level. If the cuff goes on your upper arm,  then just relax the arm on the table, arm of the chair or your lap. If you have a wrist cuff, we  suggest relaxing your wrist against your chest (think of it as Pledging the Flag with the  wrong arm).  3. Place the cuff snugly around your arm, about 1 inch above the crook of your elbow. The  cords should be inside the groove of your elbow.  4. Sit quietly, with the cuff in place, for about 5 minutes. After that 5 minutes press the power  button to start a reading. 5. Do not talk or move while the reading is taking place.  6. Record your readings on a sheet of paper. Although most cuffs have a memory, it is often  easier to see a pattern developing when the numbers are all in front of you.  7. You can repeat the reading after 1-3 minutes if it is recommended  Make sure your bladder is empty and you have not had caffeine or tobacco within the last 30 min  Always bring your blood pressure log with you to your appointments. If you have not brought your monitor in to be double checked for accuracy, please bring it to your next appointment.  You can find a list of validated (accurate) blood pressure cuffs at [employer]   Important lifestyle changes to control high blood pressure  Intervention  Effect on the BP  Lose extra pounds and watch your waistline Weight loss is one of the most effective lifestyle changes for controlling blood pressure. If you're overweight or obese, losing even a small amount of weight can help reduce  blood pressure. Blood pressure might go down by about 1 millimeter of mercury (mm Hg) with each kilogram (about 2.2 pounds) of weight lost.  Exercise regularly As a general goal, aim for at least 30 minutes of moderate physical activity every day. Regular physical activity can lower high blood pressure by about 5 to 8 mm Hg.  Eat a healthy diet Eating a diet rich in whole grains, fruits, vegetables, and low-fat dairy products and low in saturated fat and cholesterol. A healthy diet can lower high blood pressure by up to 11 mm Hg.  Reduce salt (sodium) in your diet Even a small reduction of sodium in the diet can improve heart health and reduce high blood pressure by about 5 to 6 mm Hg.  Limit alcohol One drink equals 12 ounces of beer, 5 ounces of wine, or 1.5 ounces of 80-proof liquor.  Limiting alcohol to less than one drink a day for women or two drinks a day for men can help lower blood pressure by about 4 mm Hg.   Please call me at [phone] with any questions.

## 2022-05-04 NOTE — Progress Notes (Signed)
Patient ID: [name] [name]                 DOB: 1945/09/10                      MRN: [id number]      HPI: [name] [name] is a 77 y.o. female referred by Dr. Excell Seltzer to HTN clinic. PMH is significant for hypertension, history of white coat hypertension mitral valve prolapse, and heart palpitations.  On 11/18/2021, patient was having more trouble with her blood pressure with elevated readings. She is having fatigue and shortness of breath with activity. She does not like taking carvedilol, but has remained compliant, but feels cause of fatigue. Dr. Excell Seltzer added HCTZ 12.5 mg to her regimen on 10/27.  11/8: Had appointment with Dr. Marina Goodell with home blood pressures ranging in 110-176 systolic and 60-80 diastolic. In office bp was 126/78 and labs were done since starting HCTZ 12.5 mg  Visit with Pharm D on 11/30/21 no medication changes were made. No changes were made on 02/22/22 as well since pt home BP cuff was found to be inaccurate. Patient resistant to starting statin.   Patient presents today accompanied by her husband.  She brings in her new [employer] blood pressure cuff.  She has not started exercising.  She reports being active around the house but has not started walking.  States she cannot walk in the cold due to her sinuses.  Does have a membership to the [facility] but has not used it.  She is very sensitive to medications and is hesitant to make any changes.  Reports tolerating her current medications.  Has not been checking her blood pressure as often at home.  Had COVID recently.  Home blood pressure cuff found to be accurate.  Blood pressure higher in office than at home.  1st home BP reading 180/98 2nd home reading 159/85 176/82 174/80 170/84  Current HTN meds: carvedilol 3.125 mg BID + HCTZ 12.5 mg QD Previously tried: metoprolol- tiredness, lisinopril- Hives  losartan -tiredness, amlodipine (jittery) BP goal: <130/80  Family History:  Family History  Problem Relation Age of Onset   Heart  disease Other    Hypertension Mother    Parkinson's disease Father      Social History:  reports that she has never smoked. She has never used smokeless tobacco. She reports that she does not drink alcohol and does not use drugs.   Diet: intermittent fasting 16 hours fasting  Tries to be mindful of what she is eating. Eats from th garden in the summer time. Admits to enjoying bread and butter, but knows to limit those foods. She eats mostly chicken and some red meat, does not put salt on food.   Exercise: none but willing to start 30 min walks with chair exercises 5 min per day   Home BP readings: 130-140 with heart rate 65 136/75, 134/75, 153/75, 153/82, 149/80, 144/78, 145/74, 142/76, 133/83,127/73, 148/82, 159/89,147/76, 128/75,   Office BP readings: 130/70, HR 67  Wt Readings from Last 3 Encounters:  11/30/21 157 lb (71.2 kg)  11/18/21 158 lb 12.8 oz (72 kg)  04/26/21 161 lb (73 kg)   BP Readings from Last 3 Encounters:  12/01/21 (!) 160/82  11/30/21 126/78  11/18/21 (!) 170/98   Pulse Readings from Last 3 Encounters:  11/30/21 72  11/18/21 66  04/26/21 78    Renal function: Estimated Creatinine Clearance: 37.3 mL/min (A) (by C-G formula based on SCr  of 1.21 mg/dL (H)).  Past Medical History:  Diagnosis Date   Allergic rhinitis due to pollen    CONSTIPATION    Essential hypertension 09/21/2008   Qualifier: Diagnosis of  By: Kem Parkinson     GERD    MITRAL VALVE PROLAPSE    Nonrheumatic mitral (valve) prolapse    PREMATURE ATRIAL CONTRACTIONS    Solitary pulmonary nodule     Current Outpatient Medications on File Prior to Visit  Medication Sig Dispense Refill   carvedilol (COREG) 3.125 MG tablet Take 1 tablet (3.125 mg total) by mouth 2 (two) times daily. 60 tablet 4   hydrochlorothiazide (MICROZIDE) 12.5 MG capsule Take 1 capsule (12.5 mg total) by mouth daily. 30 capsule 11   No current facility-administered medications on file prior to visit.   BMET     Component Value Date/Time   NA 143 11/30/2021 1015   K 4.2 11/30/2021 1015   CL 104 11/30/2021 1015   CO2 23 11/30/2021 1015   GLUCOSE 92 11/30/2021 1015   GLUCOSE 107 (H) 08/03/2019 1050   BUN 30 (H) 11/30/2021 1015   CREATININE 1.21 (H) 11/30/2021 1015   CALCIUM 9.4 11/30/2021 1015   EGFR 46 (L) 11/30/2021 1015   GFRNONAA 52 (L) 03/11/2020 0900    Allergies  Allergen Reactions   Clindamycin    Levaquin [Levofloxacin] Itching   Lisinopril Hives   Tru-Micin [Misc Natural Products] Other (See Comments)   Erythromycin Swelling and Rash     Assessment/Plan: Essential hypertension Assessment: Home blood pressures are variable.  With some in the low 130s/70s a few in the high 120s systolic but also several readings in the 140s to 150s systolic. Blood pressure in clinic is elevated as patient gets very nervous in the office Tolerating carvedilol 3.125 mg twice a day and HCTZ 12.5 mg daily well Very resistant to increasing the dose Has not started her exercise regimen despite discussion at last visit  Plan Recommended we increase HCTZ to 25 mg daily since her home blood pressures are not consistently at goal.  They are not consistently at goal even if we consider a goal less than 140/90 which is what the patient wishes to choose Patient did not want to increase hydrochlorothiazide or make any medication changes Strongly encourage patient to resume her exercise.  I recommended that she utilize the [facility] for times when the weather is not permitting to walk. Patient did not want multiple appointments to come back up here.  I asked her to please send me blood pressure readings in a month since we know her home cuff is accurate.  Hyperlipidemia Assessment: LDL-C is 165 (previously 168 in 03/2021) Patient does not want to start a statin due to side effect profile- has family that had reactions to medication Wants to focus on fixing her diet and starting exercise  Plan Previously was  discussed with patient and she was resistant to adding a statin. Will need to be discussed at future visits  Thank you,  Olene Floss, Pharm.D, BCPS, CPP [facility] A [facility] of [facility] [address] [place], [place] [address]  Phone: [phone]; Fax: [phone]

## 2022-06-05 NOTE — Progress Notes (Unsigned)
Subjective:  Patient ID: [name] [name], female    DOB: June 28, 1945  Age: 77 y.o. MRN: [id number]  No chief complaint on file.   HPI   Hypertension: Taking Carvedilol and HCTZ.     11/30/2021    9:00 AM 08/12/2019    2:00 PM  Depression screen PHQ 2/9  Decreased Interest 0 0  Down, Depressed, Hopeless 0 0  PHQ - 2 Score 0 0         No data to display          Patient Care Team: Abigail Miyamoto, MD (Inactive) as PCP - General (Family Medicine) [facility] (Gynecology)   Review of Systems  Current Outpatient Medications on File Prior to Visit  Medication Sig Dispense Refill   carvedilol (COREG) 3.125 MG tablet Take 1 tablet (3.125 mg total) by mouth 2 (two) times daily. 60 tablet 6   hydrochlorothiazide (MICROZIDE) 12.5 MG capsule Take 1 capsule (12.5 mg total) by mouth daily. 30 capsule 7   No current facility-administered medications on file prior to visit.   Past Medical History:  Diagnosis Date   Allergic rhinitis due to pollen    CONSTIPATION    Essential hypertension 09/21/2008   Qualifier: Diagnosis of  By: Kem Parkinson     GERD    MITRAL VALVE PROLAPSE    Nonrheumatic mitral (valve) prolapse    PREMATURE ATRIAL CONTRACTIONS    Solitary pulmonary nodule    Past Surgical History:  Procedure Laterality Date   BACK SURGERY     LAPAROSCOPIC HYSTERECTOMY      Family History  Problem Relation Age of Onset   Heart disease Other    Hypertension Mother    Parkinson's disease Father    Social History   Socioeconomic History   Marital status: Married    Spouse name: Not on file   Number of children: Not on file   Years of education: Not on file   Highest education level: Not on file  Occupational History   Not on file  Tobacco Use   Smoking status: Never   Smokeless tobacco: Never  Substance and Sexual Activity   Alcohol use: No   Drug use: Never   Sexual activity: Not Currently  Other Topics Concern   Not on  file  Social History Narrative   Not on file   Social Determinants of Health   Financial Resource Strain: Not on file  Food Insecurity: Not on file  Transportation Needs: Not on file  Physical Activity: Not on file  Stress: Not on file  Social Connections: Not on file    Objective:  There were no vitals taken for this visit.     05/04/2022   10:22 AM 12/01/2021    3:07 PM 11/30/2021    8:54 AM  BP/Weight  Systolic BP 174 160 126  Diastolic BP 80 82 78  Wt. (Lbs)   157  BMI   24.59 kg/m2    Physical Exam  Diabetic Foot Exam - Simple   No data filed      Lab Results  Component Value Date   WBC 8.1 11/30/2021   HGB 13.0 11/30/2021   HCT 38.6 11/30/2021   PLT 242 11/30/2021   GLUCOSE 92 11/30/2021   CHOL 239 (H) 11/30/2021   TRIG 124 11/30/2021   HDL 52 11/30/2021   LDLCALC 165 (H) 11/30/2021   ALT 12 11/30/2021   AST 18 11/30/2021   NA 143 11/30/2021  K 4.2 11/30/2021   CL 104 11/30/2021   CREATININE 1.21 (H) 11/30/2021   BUN 30 (H) 11/30/2021   CO2 23 11/30/2021   TSH 1.290 02/02/2016   HGBA1C 5.5 02/02/2016      Assessment & Plan:    Essential hypertension  Gastroesophageal reflux disease with esophagitis without hemorrhage  Mixed hyperlipidemia     No orders of the defined types were placed in this encounter.   No orders of the defined types were placed in this encounter.    Follow-up: No follow-ups on file.   I,Marla I Leal-Borjas,acting as a scribe for Blane Ohara, MD.,have documented all relevant documentation on the behalf of Blane Ohara, MD,as directed by  Blane Ohara, MD while in the presence of Blane Ohara, MD.   An After Visit Summary was printed and given to the patient.  Blane Ohara, MD [facility] [phone]

## 2022-06-06 ENCOUNTER — Encounter: Payer: Self-pay | Admitting: Family Medicine

## 2022-06-06 ENCOUNTER — Ambulatory Visit: Payer: Medicare PPO | Admitting: Family Medicine

## 2022-06-06 VITALS — BP 136/86 | HR 81 | Temp 97.2°F | Ht 67.0 in | Wt 147.0 lb

## 2022-06-06 DIAGNOSIS — M791 Myalgia, unspecified site: Secondary | ICD-10-CM

## 2022-06-06 DIAGNOSIS — J301 Allergic rhinitis due to pollen: Secondary | ICD-10-CM

## 2022-06-06 DIAGNOSIS — K2100 Gastro-esophageal reflux disease with esophagitis, without bleeding: Secondary | ICD-10-CM

## 2022-06-06 DIAGNOSIS — I10 Essential (primary) hypertension: Secondary | ICD-10-CM

## 2022-06-06 DIAGNOSIS — E782 Mixed hyperlipidemia: Secondary | ICD-10-CM | POA: Diagnosis not present

## 2022-06-06 DIAGNOSIS — I059 Rheumatic mitral valve disease, unspecified: Secondary | ICD-10-CM

## 2022-06-06 DIAGNOSIS — J4520 Mild intermittent asthma, uncomplicated: Secondary | ICD-10-CM

## 2022-06-06 NOTE — Assessment & Plan Note (Signed)
Recommend otc antihistamines (claritin, allegra, or zyrtec.)

## 2022-06-06 NOTE — Assessment & Plan Note (Signed)
Check labs.  Increase water intake.

## 2022-06-06 NOTE — Assessment & Plan Note (Addendum)
The current medical regimen is effective;  continue present plan and medications. Continue Carvedilol 3.125 mg twice daily and HYDROCHLOROTHIAZIDE 12.5 mg daily.  Continue to diet and exercise Labs drawn

## 2022-06-06 NOTE — Assessment & Plan Note (Signed)
No treatment required at this time.

## 2022-06-06 NOTE — Assessment & Plan Note (Addendum)
Uncontrolled.  Recommend continue to work on eating healthy diet and exercise.  Discussed importance of starting on a statin or other cholesterol medication

## 2022-06-06 NOTE — Patient Instructions (Addendum)
Drink at least 64 oz of water a day. Zyrtec, Claritin and Singulair are over the counter allergies medications you can try for allergies.

## 2022-06-06 NOTE — Assessment & Plan Note (Signed)
Well controlled.  No medication changes recommended. Continue healthy diet and exercise.  

## 2022-06-06 NOTE — Assessment & Plan Note (Signed)
Stable on echo fall 2022.

## 2022-06-07 LAB — COMPREHENSIVE METABOLIC PANEL
ALT: 15 IU/L (ref 0–32)
AST: 24 IU/L (ref 0–40)
Albumin/Globulin Ratio: 2 (ref 1.2–2.2)
Albumin: 4.3 g/dL (ref 3.8–4.8)
Alkaline Phosphatase: 54 IU/L (ref 44–121)
BUN/Creatinine Ratio: 25 (ref 12–28)
BUN: 33 mg/dL — ABNORMAL HIGH (ref 8–27)
Bilirubin Total: 0.5 mg/dL (ref 0.0–1.2)
CO2: 25 mmol/L (ref 20–29)
Calcium: 10 mg/dL (ref 8.7–10.3)
Chloride: 106 mmol/L (ref 96–106)
Creatinine, Ser: 1.32 mg/dL — ABNORMAL HIGH (ref 0.57–1.00)
Globulin, Total: 2.1 g/dL (ref 1.5–4.5)
Glucose: 91 mg/dL (ref 70–99)
Potassium: 4.7 mmol/L (ref 3.5–5.2)
Sodium: 146 mmol/L — ABNORMAL HIGH (ref 134–144)
Total Protein: 6.4 g/dL (ref 6.0–8.5)
eGFR: 41 mL/min/{1.73_m2} — ABNORMAL LOW (ref >59)

## 2022-06-07 LAB — CBC WITH DIFFERENTIAL/PLATELET
Basophils Absolute: 0.1 10*3/uL (ref 0.0–0.2)
Basos: 1 %
EOS (ABSOLUTE): 0.2 10*3/uL (ref 0.0–0.4)
Eos: 2 %
Hematocrit: 38.4 % (ref 34.0–46.6)
Hemoglobin: 13.1 g/dL (ref 11.1–15.9)
Immature Grans (Abs): 0 10*3/uL (ref 0.0–0.1)
Immature Granulocytes: 0 %
Lymphocytes Absolute: 2 10*3/uL (ref 0.7–3.1)
Lymphs: 25 %
MCH: 31.5 pg (ref 26.6–33.0)
MCHC: 34.1 g/dL (ref 31.5–35.7)
MCV: 92 fL (ref 79–97)
Monocytes Absolute: 0.6 10*3/uL (ref 0.1–0.9)
Monocytes: 8 %
Neutrophils Absolute: 5.1 10*3/uL (ref 1.4–7.0)
Neutrophils: 64 %
Platelets: 255 10*3/uL (ref 150–450)
RBC: 4.16 x10E6/uL (ref 3.77–5.28)
RDW: 12.9 % (ref 11.7–15.4)
WBC: 7.9 10*3/uL (ref 3.4–10.8)

## 2022-06-07 LAB — MAGNESIUM: Magnesium: 2.3 mg/dL (ref 1.6–2.3)

## 2022-06-07 LAB — TSH: TSH: 1.62 u[IU]/mL (ref 0.450–4.500)

## 2022-06-07 LAB — LIPID PANEL
Chol/HDL Ratio: 4.5 ratio — ABNORMAL HIGH (ref 0.0–4.4)
Cholesterol, Total: 267 mg/dL — ABNORMAL HIGH (ref 100–199)
HDL: 60 mg/dL (ref >39)
LDL Chol Calc (NIH): 187 mg/dL — ABNORMAL HIGH (ref 0–99)
Triglycerides: 111 mg/dL (ref 0–149)
VLDL Cholesterol Cal: 20 mg/dL (ref 5–40)

## 2022-06-07 LAB — CARDIOVASCULAR RISK ASSESSMENT

## 2022-06-07 LAB — PHOSPHORUS: Phosphorus: 3.1 mg/dL (ref 3.0–4.3)

## 2022-06-08 ENCOUNTER — Encounter: Payer: Self-pay | Admitting: Pharmacist

## 2022-06-08 ENCOUNTER — Other Ambulatory Visit: Payer: Self-pay | Admitting: Family Medicine

## 2022-06-08 ENCOUNTER — Telehealth: Payer: Self-pay | Admitting: Cardiovascular Disease

## 2022-06-08 DIAGNOSIS — R7989 Other specified abnormal findings of blood chemistry: Secondary | ICD-10-CM

## 2022-06-08 MED ORDER — ROSUVASTATIN CALCIUM 5 MG PO TABS: 5.0000 mg | ORAL_TABLET | Freq: Every day | ORAL | 0 refills | Status: DC

## 2022-06-08 NOTE — Telephone Encounter (Signed)
Patient is asking that you give her a call back. Please advise 

## 2022-06-08 NOTE — Telephone Encounter (Signed)
124/77 HR 78, 139/69 HR 59, 137/77, 130/73, 136/79, 135/78, 141/80, 134/83, 126/75, 140/79, 124/71, 131/82, 130/74, 126/73, 128/70  Patient called me with her blood pressures as above.  She saw her PCP the other day who did lab work which showed an increase in her serum creatinine from 6 months ago.  PCP recommended that she stop her HCTZ and increase her carvedilol to 6.25 mg.  Patient very hesitant to do this.  She reports that she does not drink much water.  Her BUN to serum creatinine ratio is a little elevated, possible that she is a little dehydrated.  I recommended that she try really hard to drink more water closer to 64 ounces and repeat the lab.  If still elevated then we would need to consider stopping.  Patient also asked about her statin.  PCP wants her on 10 mg of rosuvastatin daily.  I have also discussed this with patient in the past and she has been very hesitant to start statins, stating that she is known a lot of people who did not tolerate them or that they were not effective then.  We discussed how it is not a magic pill but will decrease her risk of moderate amount.  Her LDL cholesterol is quite high that I do not think that we need to evaluate her risk score, and that it should be treated if patient is agreeable.  I suggested that she could start rosuvastatin 5 mg daily.  We discussed not going into it with the mindset that she will have a side effect as often times this will create the nocebo effect.  I will pass this information along to PCP.

## 2022-07-03 LAB — HM DEXA SCAN

## 2022-07-03 LAB — HM MAMMOGRAPHY

## 2022-07-31 DIAGNOSIS — C44612 Basal cell carcinoma of skin of right upper limb, including shoulder: Secondary | ICD-10-CM | POA: Diagnosis not present

## 2022-08-08 ENCOUNTER — Ambulatory Visit: Payer: Medicare PPO

## 2022-08-08 VITALS — BP 132/80 | HR 65 | Resp 16 | Ht 66.0 in | Wt 147.9 lb

## 2022-08-08 DIAGNOSIS — Z0000 Encounter for general adult medical examination without abnormal findings: Secondary | ICD-10-CM | POA: Diagnosis not present

## 2022-08-08 NOTE — Progress Notes (Signed)
Subjective:   [name] [name] is a 77 y.o. female who presents for Medicare Annual (Subsequent) preventive examination.  This wellness visit is conducted by a nurse.  The patient's medications were reviewed and reconciled since the patient's last visit.  History details were provided by the patient.  The history appears to be reliable.    Medical History: Patient history and Family history was reviewed  Medications, Allergies, and preventative health maintenance was reviewed and updated.   Visit Complete: In person  Cardiac Risk Factors include: advanced age (>80men, >68 women);hypertension;dyslipidemia     Objective:    Today's Vitals   08/08/22 0931  BP: 132/80  Pulse: 65  Resp: 16  SpO2: 97%  Weight: 147 lb 14.4 oz (67.1 kg)  Height: 5\' 6"  (1.676 m)  PainSc: 0-No pain   Body mass index is 23.87 kg/m.     01/23/2017   10:52 PM 01/24/2016   11:33 PM  Advanced Directives  Does Patient Have a Medical Advance Directive? No No    Current Medications (verified) Outpatient Encounter Medications as of 08/08/2022  Medication Sig   carvedilol (COREG) 3.125 MG tablet Take 1 tablet (3.125 mg total) by mouth 2 (two) times daily.   hydrochlorothiazide (MICROZIDE) 12.5 MG capsule Take 1 capsule (12.5 mg total) by mouth daily.   rosuvastatin (CRESTOR) 5 MG tablet Take 1 tablet (5 mg total) by mouth daily. (Patient not taking: Reported on 08/08/2022)   No facility-administered encounter medications on file as of 08/08/2022.    Allergies (verified) Clindamycin, Levaquin [levofloxacin], Lisinopril, Tru-micin [misc natural products], and Erythromycin   History: Past Medical History:  Diagnosis Date   Allergic rhinitis due to pollen    CONSTIPATION    Essential hypertension 09/21/2008   Qualifier: Diagnosis of  By: Kem Parkinson     GERD    MITRAL VALVE PROLAPSE    Nonrheumatic mitral (valve) prolapse    PREMATURE ATRIAL CONTRACTIONS    Solitary pulmonary nodule    Past  Surgical History:  Procedure Laterality Date   BACK SURGERY     CATARACT EXTRACTION, BILATERAL Bilateral 2024   LAPAROSCOPIC HYSTERECTOMY     Family History  Problem Relation Age of Onset   Hypertension Mother    Heart disease Mother    Osteoporosis Mother    Thyroid disease Mother    Parkinson's disease Father    Thyroid disease Sister    Heart disease Other    Social History   Socioeconomic History   Marital status: Married    Spouse name: JW   Number of children: 2   Years of education: Not on file   Highest education level: Not on file  Occupational History   Occupation: Retired Programmer, systems  Tobacco Use   Smoking status: Never   Smokeless tobacco: Never  Substance and Sexual Activity   Alcohol use: No   Drug use: Never   Sexual activity: Not Currently  Other Topics Concern   Not on file  Social History Narrative   Not on file   Social Determinants of Health   Financial Resource Strain: Low Risk  (06/06/2022)   Overall Financial Resource Strain (CARDIA)    Difficulty of Paying Living Expenses: Not hard at all  Food Insecurity: No Food Insecurity (06/06/2022)   Hunger Vital Sign    Worried About Running Out of Food in the Last Year: Never true    Ran Out of Food in the Last Year: Never true  Transportation Needs: No Transportation Needs (06/06/2022)  PRAPARE - Administrator, Civil Service (Medical): No    Lack of Transportation (Non-Medical): No  Physical Activity: Insufficiently Active (06/06/2022)   Exercise Vital Sign    Days of Exercise per Week: 3 days    Minutes of Exercise per Session: 30 min  Stress: No Stress Concern Present (06/06/2022)   [employer] of Occupational Health - Occupational Stress Questionnaire    Feeling of Stress : Not at all  Social Connections: Moderately Integrated (06/06/2022)   Social Connection and Isolation Panel [NHANES]    Frequency of Communication with Friends and Family: Three times a week    Frequency of  Social Gatherings with Friends and Family: Three times a week    Attends Religious Services: More than 4 times per year    Active Member of Clubs or Organizations: No    Attends Banker Meetings: Never    Marital Status: Married    Tobacco Counseling Counseling given: Not Answered   Clinical Intake:  Pre-visit preparation completed: Yes Pain : No/denies pain Pain Score: 0-No pain   BMI - recorded: 23.87 Nutritional Status: BMI of 19-24  Normal Nutritional Risks: None Diabetes: No How often do you need to have someone help you when you read instructions, pamphlets, or other written materials from your doctor or pharmacy?: 1 - Never Interpreter Needed?: No     Activities of Daily Living    08/08/2022   10:13 AM  In your present state of health, do you have any difficulty performing the following activities:  Hearing? 0  Vision? 0  Difficulty concentrating or making decisions? 0  Walking or climbing stairs? 0  Dressing or bathing? 0  Doing errands, shopping? 0  Preparing Food and eating ? N  Using the Toilet? N  In the past six months, have you accidently leaked urine? N  Do you have problems with loss of bowel control? N  Managing your Medications? N  Managing your Finances? N  Housekeeping or managing your Housekeeping? N    Patient Care Team: Blane Ohara, MD as PCP - General (Family Medicine) [facility], [facility] (Gynecology) Marcelle Overlie, MD as Consulting Physician (Obstetrics and Gynecology) Tonny Bollman, MD as Consulting Physician (Cardiology) Carmel Sacramento, MD as Referring Physician (Dermatology)     Assessment:   This is a routine wellness examination for [name] [name].  Hearing/Vision screen No results found.  Dietary issues and exercise activities discussed: Aim for 30 minutes of exercise or brisk walking, 6-8 glasses of water, and 5 servings of fruits and vegetables each day.      Goals Addressed              This Visit's Progress    Prevent falls       08/08/2022 AWV Goal: Fall Prevention  Over the next year, patient will decrease their risk for falls by: Using assistive devices, such as a cane or walker, as needed Identifying fall risks within their home and correcting them by: Removing throw rugs Adding handrails to stairs or ramps Removing clutter and keeping a clear pathway throughout the home Increasing light, especially at night Adding shower handles/bars Raising toilet seat Identifying potential personal risk factors for falls: Medication side effects Incontinence/urgency Vestibular dysfunction Hearing loss Musculoskeletal disorders Neurological disorders Orthostatic hypotension         Depression Screen    08/08/2022   10:12 AM 06/06/2022    8:00 AM 11/30/2021    9:00 AM 08/12/2019  2:00 PM  PHQ 2/9 Scores  PHQ - 2 Score 0 0 0 0    Fall Risk    08/08/2022    9:14 AM 06/06/2022    8:00 AM  Fall Risk   Falls in the past year? 0 0  Number falls in past yr: 0 0  Injury with Fall? 0 0  Risk for fall due to : No Fall Risks No Fall Risks  Follow up Falls evaluation completed;Education provided Falls evaluation completed    MEDICARE RISK AT HOME:  Medicare Risk at Home - 08/08/22 0914     Any stairs in or around the home? Yes    If so, are there any without handrails? No    Home free of loose throw rugs in walkways, pet beds, electrical cords, etc? Yes    Adequate lighting in your home to reduce risk of falls? Yes    Life alert? No    Use of a cane, walker or w/c? No    Grab bars in the bathroom? No    Shower chair or bench in shower? No    Elevated toilet seat or a handicapped toilet? No             TIMED UP AND GO:  Was the test performed?  Yes  Length of time to ambulate 10 feet: 6 sec Gait steady and fast without use of assistive device    Cognitive Function:        08/08/2022   10:13 AM  6CIT Screen  What Year? 0 points  What  month? 0 points  What time? 0 points  Count back from 20 0 points  Months in reverse 0 points  Repeat phrase 0 points  Total Score 0 points    Immunizations Immunization History  Administered Date(s) Administered   Fluad Quad(high Dose 65+) 11/20/2019, 11/30/2021   Hepatitis B 03/01/2000, 05/07/2000, 09/04/2000   Influenza-Unspecified 11/08/2017, 11/15/2018, 11/29/2020   PFIZER(Purple Top)SARS-COV-2 Vaccination 02/09/2019, 03/01/2019, 10/30/2019   Pneumococcal Conjugate-13 01/19/2015   Pneumococcal Polysaccharide-23 06/08/2011    TDAP status: Due, Education has been provided regarding the importance of this vaccine. Advised may receive this vaccine at local pharmacy or Health Dept. Aware to provide a copy of the vaccination record if obtained from local pharmacy or Health Dept. Verbalized acceptance and understanding.  Flu Vaccine status: Up to date  Pneumococcal vaccine status: Up to date  Covid-19 vaccine status: Information provided on how to obtain vaccines.   Qualifies for Shingles Vaccine? Yes   Zostavax completed Yes   Shingrix Completed?: No.    Education has been provided regarding the importance of this vaccine. Patient has been advised to call insurance company to determine out of pocket expense if they have not yet received this vaccine. Advised may also receive vaccine at local pharmacy or Health Dept. Verbalized acceptance and understanding.  Screening Tests Health Maintenance  Topic Date Due   Medicare Annual Wellness (AWV)  Never done   DTaP/Tdap/Td (1 - Tdap) Never done   Zoster Vaccines- Shingrix (1 of 2) Never done   COVID-19 Vaccine (4 - 2023-24 season) 09/23/2021   INFLUENZA VACCINE  08/24/2022   MAMMOGRAM  07/02/2024   Pneumonia Vaccine 13+ Years old  Completed   DEXA SCAN  Completed   Hepatitis C Screening  Completed   HPV VACCINES  Aged Out   Colonoscopy  Discontinued    Health Maintenance  Health Maintenance Due  Topic Date Due   Medicare  Annual Wellness (AWV)  Never done   DTaP/Tdap/Td (1 - Tdap) Never done   Zoster Vaccines- Shingrix (1 of 2) Never done   COVID-19 Vaccine (4 - 2023-24 season) 09/23/2021    Colorectal cancer screening: No longer required.   Mammogram status: Completed 06/2022. Repeat every year  Bone Density status: Completed 06/2022. Results reflect: Bone density results: OSTEOPENIA. Repeat every 2 years.  Lung Cancer Screening: (Low Dose CT Chest recommended if Age 60-80 years, 20 pack-year currently smoking OR have quit w/in 15years.) does not qualify.   Lung Cancer Screening Referral: N/A  Additional Screening:  Vision Screening: Recommended annual ophthalmology exams for early detection of glaucoma and other disorders of the eye. Is the patient up to date with their annual eye exam?  Yes   Dental Screening: Recommended annual dental exams for proper oral hygiene   Community Resource Referral / Chronic Care Management: CRR required this visit?  No   CCM required this visit?  No     Plan:    1- Tetanus and Shingles vaccines recommended  Counseling was provided today regarding the following topics: healthy eating habits, home safety, vitamin and mineral supplementation (calcium and Vit D), regular exercise, breast self-exam, tobacco avoidance, limitation of alcohol intake, use of seat belts, firearm safety, and fall prevention.  Annual recommendations include: influenza vaccine, dental cleanings, and eye exams.   I have personally reviewed and noted the following in the patient's chart:   Medical and social history Use of alcohol, tobacco or illicit drugs  Current medications and supplements including opioid prescriptions. Patient is not currently taking opioid prescriptions. Functional ability and status Nutritional status Physical activity Advanced directives List of other physicians Hospitalizations, surgeries, and ER visits in previous 12 months Vitals Screenings to include  cognitive, depression, and falls Referrals and appointments  In addition, I have reviewed and discussed with patient certain preventive protocols, quality metrics, and best practice recommendations. A written personalized care plan for preventive services as well as general preventive health recommendations were provided to patient.     Jacklynn Bue, LPN   09/02/9145   After Visit Summary: Available on MyChart

## 2022-08-08 NOTE — Patient Instructions (Signed)
[name] [name] , Thank you for taking time to come for your Medicare Wellness Visit. I appreciate your ongoing commitment to your health goals. Please review the following plan we discussed and let me know if I can assist you in the future.   These are the goals we discussed:  Goals      Prevent falls     08/08/2022 AWV Goal: Fall Prevention  Over the next year, patient will decrease their risk for falls by: Using assistive devices, such as a cane or walker, as needed Identifying fall risks within their home and correcting them by: Removing throw rugs Adding handrails to stairs or ramps Removing clutter and keeping a clear pathway throughout the home Increasing light, especially at night Adding shower handles/bars Raising toilet seat Identifying potential personal risk factors for falls: Medication side effects Incontinence/urgency Vestibular dysfunction Hearing loss Musculoskeletal disorders Neurological disorders Orthostatic hypotension          This is a list of the screening recommended for you and due dates:  Health Maintenance  Topic Date Due   DTaP/Tdap/Td vaccine (1 - Tdap) Never done   Zoster (Shingles) Vaccine (1 of 2) Never done   COVID-19 Vaccine (4 - 2023-24 season) 09/23/2021   Flu Shot  08/24/2022   Medicare Annual Wellness Visit  08/08/2023   Mammogram  07/02/2024   Pneumonia Vaccine  Completed   DEXA scan (bone density measurement)  Completed   Hepatitis C Screening  Completed   HPV Vaccine  Aged Out   Colon Cancer Screening  Discontinued    Tetanus and Shingrix Vaccines - you can get these at the pharmacy    Preventive Care 65 Years and Older, Female Preventive care refers to lifestyle choices and visits with your health care provider that can promote health and wellness. What does preventive care include? A yearly physical exam. This is also called an annual well check. Dental exams once or twice a year. Routine eye exams. Ask your health care  provider how often you should have your eyes checked. Personal lifestyle choices, including: Daily care of your teeth and gums. Regular physical activity. Eating a healthy diet. Avoiding tobacco and drug use. Limiting alcohol use. Practicing safe sex. Taking low-dose aspirin every day. Taking vitamin and mineral supplements as recommended by your health care provider. What happens during an annual well check? The services and screenings done by your health care provider during your annual well check will depend on your age, overall health, lifestyle risk factors, and family history of disease. Counseling  Your health care provider may ask you questions about your: Alcohol use. Tobacco use. Drug use. Emotional well-being. Home and relationship well-being. Sexual activity. Eating habits. History of falls. Memory and ability to understand (cognition). Work and work Astronomer. Reproductive health. Screening  You may have the following tests or measurements: Height, weight, and BMI. Blood pressure. Lipid and cholesterol levels. These may be checked every 5 years, or more frequently if you are over 20 years old. Skin check. Lung cancer screening. You may have this screening every year starting at age 77 if you have a 30-pack-year history of smoking and currently smoke or have quit within the past 15 years. Fecal occult blood test (FOBT) of the stool. You may have this test every year starting at age 33. Flexible sigmoidoscopy or colonoscopy. You may have a sigmoidoscopy every 5 years or a colonoscopy every 10 years starting at age 54. Hepatitis C blood test. Hepatitis B blood test. Sexually transmitted  disease (STD) testing. Diabetes screening. This is done by checking your blood sugar (glucose) after you have not eaten for a while (fasting). You may have this done every 1-3 years. Bone density scan. This is done to screen for osteoporosis. You may have this done starting at age  104. Mammogram. This may be done every 1-2 years. Talk to your health care provider about how often you should have regular mammograms. Talk with your health care provider about your test results, treatment options, and if necessary, the need for more tests. Vaccines  Your health care provider may recommend certain vaccines, such as: Influenza vaccine. This is recommended every year. Tetanus, diphtheria, and acellular pertussis (Tdap, Td) vaccine. You may need a Td booster every 10 years. Zoster vaccine. You may need this after age 89. Pneumococcal 13-valent conjugate (PCV13) vaccine. One dose is recommended after age 73. Pneumococcal polysaccharide (PPSV23) vaccine. One dose is recommended after age 42. Talk to your health care provider about which screenings and vaccines you need and how often you need them. This information is not intended to replace advice given to you by your health care provider. Make sure you discuss any questions you have with your health care provider. Document Released: 02/05/2015 Document Revised: 09/29/2015 Document Reviewed: 11/10/2014 Elsevier Interactive Patient Education  2017 [employer].  Fall Prevention in the Home Falls can cause injuries. They can happen to people of all ages. There are many things you can do to make your home safe and to help prevent falls. What can I do on the outside of my home? Regularly fix the edges of walkways and driveways and fix any cracks. Remove anything that might make you trip as you walk through a door, such as a raised step or threshold. Trim any bushes or trees on the path to your home. Use bright outdoor lighting. Clear any walking paths of anything that might make someone trip, such as rocks or tools. Regularly check to see if handrails are loose or broken. Make sure that both sides of any steps have handrails. Any raised decks and porches should have guardrails on the edges. Have any leaves, snow, or ice cleared  regularly. Use sand or salt on walking paths during winter. Clean up any spills in your garage right away. This includes oil or grease spills. What can I do in the bathroom? Use night lights. Install grab bars by the toilet and in the tub and shower. Do not use towel bars as grab bars. Use non-skid mats or decals in the tub or shower. If you need to sit down in the shower, use a plastic, non-slip stool. Keep the floor dry. Clean up any water that spills on the floor as soon as it happens. Remove soap buildup in the tub or shower regularly. Attach bath mats securely with double-sided non-slip rug tape. Do not have throw rugs and other things on the floor that can make you trip. What can I do in the bedroom? Use night lights. Make sure that you have a light by your bed that is easy to reach. Do not use any sheets or blankets that are too big for your bed. They should not hang down onto the floor. Have a firm chair that has side arms. You can use this for support while you get dressed. Do not have throw rugs and other things on the floor that can make you trip. What can I do in the kitchen? Clean up any spills right away. Avoid walking  on wet floors. Keep items that you use a lot in easy-to-reach places. If you need to reach something above you, use a strong step stool that has a grab bar. Keep electrical cords out of the way. Do not use floor polish or wax that makes floors slippery. If you must use wax, use non-skid floor wax. Do not have throw rugs and other things on the floor that can make you trip. What can I do with my stairs? Do not leave any items on the stairs. Make sure that there are handrails on both sides of the stairs and use them. Fix handrails that are broken or loose. Make sure that handrails are as long as the stairways. Check any carpeting to make sure that it is firmly attached to the stairs. Fix any carpet that is loose or worn. Avoid having throw rugs at the top or  bottom of the stairs. If you do have throw rugs, attach them to the floor with carpet tape. Make sure that you have a light switch at the top of the stairs and the bottom of the stairs. If you do not have them, ask someone to add them for you. What else can I do to help prevent falls? Wear shoes that: Do not have high heels. Have rubber bottoms. Are comfortable and fit you well. Are closed at the toe. Do not wear sandals. If you use a stepladder: Make sure that it is fully opened. Do not climb a closed stepladder. Make sure that both sides of the stepladder are locked into place. Ask someone to hold it for you, if possible. Clearly mark and make sure that you can see: Any grab bars or handrails. First and last steps. Where the edge of each step is. Use tools that help you move around (mobility aids) if they are needed. These include: Canes. Walkers. Scooters. Crutches. Turn on the lights when you go into a dark area. Replace any light bulbs as soon as they burn out. Set up your furniture so you have a clear path. Avoid moving your furniture around. If any of your floors are uneven, fix them. If there are any pets around you, be aware of where they are. Review your medicines with your doctor. Some medicines can make you feel dizzy. This can increase your chance of falling. Ask your doctor what other things that you can do to help prevent falls. This information is not intended to replace advice given to you by your health care provider. Make sure you discuss any questions you have with your health care provider. Document Released: 11/05/2008 Document Revised: 06/17/2015 Document Reviewed: 02/13/2014 Elsevier Interactive Patient Education  2017 [employer].

## 2022-10-06 ENCOUNTER — Encounter: Payer: Self-pay | Admitting: Cardiovascular Disease

## 2022-12-03 ENCOUNTER — Other Ambulatory Visit: Payer: Self-pay | Admitting: Cardiovascular Disease

## 2022-12-03 DIAGNOSIS — R0609 Other forms of dyspnea: Secondary | ICD-10-CM

## 2022-12-03 DIAGNOSIS — I10 Essential (primary) hypertension: Secondary | ICD-10-CM

## 2022-12-04 ENCOUNTER — Ambulatory Visit: Payer: Medicare PPO | Admitting: Cardiovascular Disease

## 2022-12-07 ENCOUNTER — Ambulatory Visit: Payer: Medicare PPO | Admitting: Family Medicine

## 2023-01-02 ENCOUNTER — Ambulatory Visit: Payer: Medicare PPO | Admitting: Family Medicine

## 2023-01-05 DIAGNOSIS — Z85828 Personal history of other malignant neoplasm of skin: Secondary | ICD-10-CM | POA: Diagnosis not present

## 2023-01-05 DIAGNOSIS — Z08 Encounter for follow-up examination after completed treatment for malignant neoplasm: Secondary | ICD-10-CM | POA: Diagnosis not present

## 2023-01-05 DIAGNOSIS — L814 Other melanin hyperpigmentation: Secondary | ICD-10-CM | POA: Diagnosis not present

## 2023-01-05 DIAGNOSIS — R229 Localized swelling, mass and lump, unspecified: Secondary | ICD-10-CM | POA: Diagnosis not present

## 2023-01-05 DIAGNOSIS — L821 Other seborrheic keratosis: Secondary | ICD-10-CM | POA: Diagnosis not present

## 2023-01-05 DIAGNOSIS — C44622 Squamous cell carcinoma of skin of right upper limb, including shoulder: Secondary | ICD-10-CM | POA: Diagnosis not present

## 2023-01-05 DIAGNOSIS — D229 Melanocytic nevi, unspecified: Secondary | ICD-10-CM | POA: Diagnosis not present

## 2023-01-07 NOTE — Progress Notes (Deleted)
Subjective:  Patient ID: [name] [name], female    DOB: Aug 08, 1945  Age: 77 y.o. MRN: [id number]  Chief Complaint  Patient presents with   Medical Management of Chronic Issues    HPI   Hypertension: Taking Carvedilol 3.125 mg twice daily and HYDROCHLOROTHIAZIDE 12.5 mg daily.  Sometimes has cramps in her legs at night. Hyperlipidemia: LDL high. Patient has never tried chol medicines. She is scared of them.  MVP Allergic rhinitis/asthma: has some allergy medicines at home which she takes sporadically.     08/08/2022   10:12 AM 06/06/2022    8:00 AM 11/30/2021    9:00 AM 08/12/2019    2:00 PM  Depression screen PHQ 2/9  Decreased Interest 0 0 0 0  Down, Depressed, Hopeless 0 0 0 0  PHQ - 2 Score 0 0 0 0        08/08/2022    9:14 AM  Fall Risk   Falls in the past year? 0  Number falls in past yr: 0  Injury with Fall? 0  Risk for fall due to : No Fall Risks  Follow up Falls evaluation completed;Education provided    Patient Care Team: Blane Ohara, MD as PCP - General (Family Medicine) Ginette Otto, [facility] (Gynecology) Marcelle Overlie, MD as Consulting Physician (Obstetrics and Gynecology) Tonny Bollman, MD as Consulting Physician (Cardiology) Carmel Sacramento, MD as Referring Physician (Dermatology)   Review of Systems  Current Outpatient Medications on File Prior to Visit  Medication Sig Dispense Refill   carvedilol (COREG) 3.125 MG tablet Take 1 tablet (3.125 mg total) by mouth 2 (two) times daily. 60 tablet 6   hydrochlorothiazide (MICROZIDE) 12.5 MG capsule TAKE 1 CAPSULE BY MOUTH DAILY 90 capsule 0   rosuvastatin (CRESTOR) 5 MG tablet Take 1 tablet (5 mg total) by mouth daily. (Patient not taking: Reported on 08/08/2022) 90 tablet 0   No current facility-administered medications on file prior to visit.   Past Medical History:  Diagnosis Date   Allergic rhinitis due to pollen    CONSTIPATION    Essential hypertension 09/21/2008    Qualifier: Diagnosis of  By: Kem Parkinson     GERD    MITRAL VALVE PROLAPSE    Nonrheumatic mitral (valve) prolapse    PREMATURE ATRIAL CONTRACTIONS    Solitary pulmonary nodule    Past Surgical History:  Procedure Laterality Date   BACK SURGERY     CATARACT EXTRACTION, BILATERAL Bilateral 2024   LAPAROSCOPIC HYSTERECTOMY      Family History  Problem Relation Age of Onset   Hypertension Mother    Heart disease Mother    Osteoporosis Mother    Thyroid disease Mother    Parkinson's disease Father    Thyroid disease Sister    Heart disease Other    Social History   Socioeconomic History   Marital status: Married    Spouse name: JW   Number of children: 2   Years of education: Not on file   Highest education level: Not on file  Occupational History   Occupation: Retired Programmer, systems  Tobacco Use   Smoking status: Never   Smokeless tobacco: Never  Substance and Sexual Activity   Alcohol use: No   Drug use: Never   Sexual activity: Not Currently  Other Topics Concern   Not on file  Social History Narrative   Not on file   Social Drivers of Health   Financial Resource Strain: Low Risk  (06/06/2022)  Overall Financial Resource Strain (CARDIA)    Difficulty of Paying Living Expenses: Not hard at all  Food Insecurity: No Food Insecurity (06/06/2022)   Hunger Vital Sign    Worried About Running Out of Food in the Last Year: Never true    Ran Out of Food in the Last Year: Never true  Transportation Needs: No Transportation Needs (06/06/2022)   PRAPARE - Administrator, Civil Service (Medical): No    Lack of Transportation (Non-Medical): No  Physical Activity: Insufficiently Active (06/06/2022)   Exercise Vital Sign    Days of Exercise per Week: 3 days    Minutes of Exercise per Session: 30 min  Stress: No Stress Concern Present (06/06/2022)   [employer] of Occupational Health - Occupational Stress Questionnaire    Feeling of Stress : Not at all   Social Connections: Moderately Integrated (06/06/2022)   Social Connection and Isolation Panel [NHANES]    Frequency of Communication with Friends and Family: Three times a week    Frequency of Social Gatherings with Friends and Family: Three times a week    Attends Religious Services: More than 4 times per year    Active Member of Clubs or Organizations: No    Attends Banker Meetings: Never    Marital Status: Married    Objective:  There were no vitals taken for this visit.     08/08/2022    9:31 AM 06/06/2022    8:00 AM 05/04/2022   10:22 AM  BP/Weight  Systolic BP 132 136 174  Diastolic BP 80 86 80  Wt. (Lbs) 147.9 147   BMI 23.87 kg/m2 23.02 kg/m2     Physical Exam  Diabetic Foot Exam - Simple   No data filed      Lab Results  Component Value Date   WBC 7.9 06/06/2022   HGB 13.1 06/06/2022   HCT 38.4 06/06/2022   PLT 255 06/06/2022   GLUCOSE 91 06/06/2022   CHOL 267 (H) 06/06/2022   TRIG 111 06/06/2022   HDL 60 06/06/2022   LDLCALC 187 (H) 06/06/2022   ALT 15 06/06/2022   AST 24 06/06/2022   NA 146 (H) 06/06/2022   K 4.7 06/06/2022   CL 106 06/06/2022   CREATININE 1.32 (H) 06/06/2022   BUN 33 (H) 06/06/2022   CO2 25 06/06/2022   TSH 1.620 06/06/2022   HGBA1C 5.5 02/02/2016      Assessment & Plan:    Essential hypertension  Gastroesophageal reflux disease with esophagitis without hemorrhage  Mixed hyperlipidemia     No orders of the defined types were placed in this encounter.   No orders of the defined types were placed in this encounter.    Follow-up: No follow-ups on file.   I,Elisa Sorlie I Leal-Borjas,acting as a scribe for Blane Ohara, MD.,have documented all relevant documentation on the behalf of Blane Ohara, MD,as directed by  Blane Ohara, MD while in the presence of Blane Ohara, MD.   An After Visit Summary was printed and given to the patient.  Blane Ohara, MD [facility] [phone]

## 2023-01-08 ENCOUNTER — Ambulatory Visit: Payer: Medicare PPO | Admitting: Family Medicine

## 2023-01-14 NOTE — Progress Notes (Signed)
Cancelled. Dr. Cox  

## 2023-01-15 ENCOUNTER — Ambulatory Visit: Payer: Medicare PPO | Admitting: Family Medicine

## 2023-01-23 DIAGNOSIS — M5416 Radiculopathy, lumbar region: Secondary | ICD-10-CM | POA: Diagnosis not present

## 2023-01-23 DIAGNOSIS — M545 Low back pain, unspecified: Secondary | ICD-10-CM | POA: Diagnosis not present

## 2023-01-28 NOTE — Progress Notes (Deleted)
 Cardiology Office Note:    Date:  01/28/2023   ID:  SAE HANDRICH, DOB 1945-05-25, MRN [id number]  PCP:  Sherre Clapper, MD   [facility] Providers Cardiologist:  None     Referring MD: Sherre Clapper, MD   No chief complaint on file.   History of Present Illness:    [name] [name] is a 78 y.o. female with a hx of hypertension, mitral valve prolapse, and heart palpitations, presenting for follow-up evaluation. She has a longstanding history of whitecoat hypertension. She has had problems with stress and anxiety. She has had a lot of difficulty tolerating antihypertensive medications with intolerance to metoprolol , lisinopril, and amlodipine . BP was uncontrolled at the time of her last visit. She was started on hydrochlorothiazide  and [facility].    Current Medications: No outpatient medications have been marked as taking for the 01/29/23 encounter (Appointment) with Wonda Sharper, MD.     Allergies:   Clindamycin, Levaquin [levofloxacin], Lisinopril, Tru-micin [misc natural products], and Erythromycin   ROS:   Please see the history of present illness.    All other systems reviewed and are negative.  EKGs/Labs/Other Studies Reviewed:    The following studies were reviewed today: Cardiac Studies & Procedures      ECHOCARDIOGRAM  ECHOCARDIOGRAM COMPLETE 12/01/2021  Narrative ECHOCARDIOGRAM REPORT    Patient Name:   [name] [name] Date of Exam: 12/01/2021 Medical Rec #:  [id number]      Height:       67.0 in Accession #:    [id number]     Weight:       157.0 lb Date of Birth:  04-Apr-1945       BSA:          1.825 m Patient Age:    76 years       BP:           126/78 mmHg Patient Gender: F              HR:           65 bpm. Exam Location:  [address]  Procedure: 2D Echo, Cardiac Doppler and Color Doppler  Indications:    R06.02 SOB  History:        Patient has prior history of Echocardiogram examinations, most recent 10/01/2008. Mitral  Valve Prolapse, Arrythmias:PAC; Risk Factors:Hypertension. Dyspnea on exertion. Asthma. Palpitations.  Sonographer:    Jon Hacker RCS Referring Phys: [phone] Seaira Byus  IMPRESSIONS   1. Left ventricular ejection fraction, by estimation, is 60 to 65%. The left ventricle has normal function. The left ventricle has no regional wall motion abnormalities. Left ventricular diastolic parameters are consistent with Grade I diastolic dysfunction (impaired relaxation). 2. Right ventricular systolic function is normal. The right ventricular size is normal. 3. The mitral valve is myxomatous. Mild mitral valve regurgitation. No evidence of mitral stenosis. There is mild holosystolic prolapse of the middle scallop of the posterior leaflet of the mitral valve. 4. The aortic valve is tricuspid. There is mild calcification of the aortic valve. Aortic valve regurgitation is not visualized. Aortic valve sclerosis is present, with no evidence of aortic valve stenosis. 5. The inferior vena cava is normal in size with greater than 50% respiratory variability, suggesting right atrial pressure of 3 mmHg.  FINDINGS Left Ventricle: Left ventricular ejection fraction, by estimation, is 60 to 65%. The left ventricle has normal function. The left ventricle has no regional wall motion abnormalities. The left ventricular internal cavity  size was normal in size. There is no left ventricular hypertrophy. Left ventricular diastolic parameters are consistent with Grade I diastolic dysfunction (impaired relaxation).  Right Ventricle: The right ventricular size is normal. No increase in right ventricular wall thickness. Right ventricular systolic function is normal.  Left Atrium: Left atrial size was normal in size.  Right Atrium: Right atrial size was normal in size.  Pericardium: There is no evidence of pericardial effusion.  Mitral Valve: The mitral valve is myxomatous. There is mild holosystolic prolapse of the  middle scallop of the posterior leaflet of the mitral valve. Mild mitral annular calcification. Mild mitral valve regurgitation. No evidence of mitral valve stenosis.  Tricuspid Valve: The tricuspid valve is normal in structure. Tricuspid valve regurgitation is trivial. No evidence of tricuspid stenosis.  Aortic Valve: The aortic valve is tricuspid. There is mild calcification of the aortic valve. Aortic valve regurgitation is not visualized. Aortic valve sclerosis is present, with no evidence of aortic valve stenosis.  Pulmonic Valve: The pulmonic valve was normal in structure. Pulmonic valve regurgitation is trivial. No evidence of pulmonic stenosis.  Aorta: The aortic root is normal in size and structure.  Venous: The inferior vena cava is normal in size with greater than 50% respiratory variability, suggesting right atrial pressure of 3 mmHg.  IAS/Shunts: No atrial level shunt detected by color flow Doppler.   LEFT VENTRICLE PLAX 2D LVIDd:         3.80 cm   Diastology LVIDs:         2.30 cm   LV e' medial:    8.05 cm/s LV PW:         1.10 cm   LV E/e' medial:  10.0 LV IVS:        1.50 cm   LV e' lateral:   8.38 cm/s LVOT diam:     1.90 cm   LV E/e' lateral: 9.6 LV SV:         80 LV SV Index:   44 LVOT Area:     2.84 cm   RIGHT VENTRICLE RV Basal diam:  2.60 cm RV S prime:     16.50 cm/s TAPSE (M-mode): 2.4 cm  LEFT ATRIUM             Index        RIGHT ATRIUM           Index LA diam:        3.90 cm 2.14 cm/m   RA Area:     11.10 cm LA Vol (A2C):   31.5 ml 17.26 ml/m  RA Volume:   22.00 ml  12.06 ml/m LA Vol (A4C):   34.9 ml 19.13 ml/m LA Biplane Vol: 34.2 ml 18.74 ml/m AORTIC VALVE LVOT Vmax:   129.00 cm/s LVOT Vmean:  77.000 cm/s LVOT VTI:    0.281 m  AORTA Ao Root diam: 3.10 cm Ao Asc diam:  3.40 cm  MITRAL VALVE MV Area (PHT): 3.08 cm     SHUNTS MV Decel Time: 246 msec     Systemic VTI:  0.28 m MV E velocity: 80.70 cm/s   Systemic Diam: 1.90 cm MV A  velocity: 101.00 cm/s MV E/A ratio:  0.80  Oneil Parchment MD Electronically signed by Oneil Parchment MD Signature Date/Time: 12/01/2021/3:40:35 PM    Final             EKG:        Recent Labs: 06/06/2022: ALT 15; BUN 33; Creatinine, Ser 1.32;  Hemoglobin 13.1; Magnesium 2.3; Platelets 255; Potassium 4.7; Sodium 146; TSH 1.620  Recent Lipid Panel    Component Value Date/Time   CHOL 267 (H) 06/06/2022 0838   TRIG 111 06/06/2022 0838   HDL 60 06/06/2022 0838   CHOLHDL 4.5 (H) 06/06/2022 0838   LDLCALC 187 (H) 06/06/2022 9161     Risk Assessment/Calculations:      No BP recorded.  {Refresh Note OR Click here to enter BP  :1}***         Physical Exam:    VS:  There were no vitals taken for this visit.    Wt Readings from Last 3 Encounters:  08/08/22 147 lb 14.4 oz (67.1 kg)  06/06/22 147 lb (66.7 kg)  11/30/21 157 lb (71.2 kg)     GEN: *** Well nourished, well developed in no acute distress HEENT: Normal NECK: No JVD; No carotid bruits LYMPHATICS: No lymphadenopathy CARDIAC: ***RRR, no murmurs, rubs, gallops RESPIRATORY:  Clear to auscultation without rales, wheezing or rhonchi  ABDOMEN: Soft, non-tender, non-distended MUSCULOSKELETAL:  No edema; No deformity  SKIN: Warm and dry NEUROLOGIC:  Alert and oriented x 3 PSYCHIATRIC:  Normal affect   Assessment & Plan Essential hypertension  Mixed hyperlipidemia        {Are you ordering a CV Procedure (e.g. stress test, cath, DCCV, TEE, etc)?   Press F2        :[id number]}    Medication Adjustments/Labs and Tests Ordered: Current medicines are reviewed at length with the patient today.  Concerns regarding medicines are outlined above.  No orders of the defined types were placed in this encounter.  No orders of the defined types were placed in this encounter.   There are no Patient Instructions on file for this visit.   Signed, Ozell Fell, MD  01/28/2023 8:28 PM    [facility]

## 2023-01-29 ENCOUNTER — Ambulatory Visit: Payer: Medicare PPO | Admitting: Cardiovascular Disease

## 2023-01-29 ENCOUNTER — Encounter: Payer: Self-pay | Admitting: Cardiovascular Disease

## 2023-01-29 ENCOUNTER — Ambulatory Visit: Payer: Medicare PPO | Attending: Cardiovascular Disease | Admitting: Cardiovascular Disease

## 2023-01-29 VITALS — BP 130/90 | HR 71 | Ht 67.0 in | Wt 156.4 lb

## 2023-01-29 DIAGNOSIS — E785 Hyperlipidemia, unspecified: Secondary | ICD-10-CM | POA: Diagnosis not present

## 2023-01-29 DIAGNOSIS — I059 Rheumatic mitral valve disease, unspecified: Secondary | ICD-10-CM | POA: Diagnosis not present

## 2023-01-29 DIAGNOSIS — E782 Mixed hyperlipidemia: Secondary | ICD-10-CM

## 2023-01-29 DIAGNOSIS — I10 Essential (primary) hypertension: Secondary | ICD-10-CM

## 2023-01-29 MED ORDER — CARVEDILOL 6.25 MG PO TABS: 6.2500 mg | ORAL_TABLET | Freq: Two times a day (BID) | ORAL | 3 refills | Status: DC

## 2023-01-29 NOTE — Assessment & Plan Note (Signed)
 Last LDL cholesterol was 187.  She will start rosuvastatin  5 mg daily.  She is counseled about her approach to medication, concerns over side effects, and risk/benefit of lipid-lowering therapies.  She should have a follow-up lipid panel and LFTs checked in a few months.

## 2023-01-29 NOTE — Assessment & Plan Note (Signed)
 Blood pressure control is suboptimal.  Increase carvedilol  to 6.25 mg twice daily.  Continue hydrochlorothiazide .  Check metabolic panel today.  Last creatinine was 1.32.  Will need to follow this with serial lab tests and make sure she stays adequately hydrated.

## 2023-01-29 NOTE — Assessment & Plan Note (Signed)
 No murmur on exam.  Continue clinical follow-up.

## 2023-01-29 NOTE — Progress Notes (Signed)
 Cardiology Office Note:    Date:  01/29/2023   ID:  [name] [name], DOB 1945-07-16, MRN [id number]  PCP:  Sherre Clapper, MD   [facility] Providers Cardiologist:  None     Referring MD: Sherre Clapper, MD   Chief Complaint  Patient presents with   Hypertension    History of Present Illness:    [name] [name] is a 78 y.o. female with a hx of hypertension, mitral valve prolapse, and heart palpitations, presenting for follow-up evaluation. She has a longstanding history of whitecoat hypertension. She has had problems with stress and anxiety. She has had a lot of difficulty tolerating antihypertensive medications with intolerance to metoprolol , lisinopril, and amlodipine . BP was uncontrolled at the time of her last visit. She was started on hydrochlorothiazide  and [facility].   The patient is here with her husband today.  She has been doing okay from a cardiac standpoint and denies any symptoms of chest pain, chest pressure, shortness of breath, or heart palpitations.  She denies leg edema, orthopnea, PND, lightheadedness, or syncope.  Her blood pressure has been elevated after taking a course of prednisone  and nonsteroidal anti-inflammatories for her back problem.  She has not started taking any lipid-lowering therapy because of concerns about statin side effects.   Current Medications: Current Meds  Medication Sig   amoxicillin (AMOXIL) 500 MG capsule Prior to dental appointments   carvedilol  (COREG ) 6.25 MG tablet Take 1 tablet (6.25 mg total) by mouth 2 (two) times daily.   hydrochlorothiazide  (MICROZIDE ) 12.5 MG capsule TAKE 1 CAPSULE BY MOUTH DAILY   [DISCONTINUED] carvedilol  (COREG ) 3.125 MG tablet Take 1 tablet (3.125 mg total) by mouth 2 (two) times daily.   [DISCONTINUED] rosuvastatin  (CRESTOR ) 5 MG tablet Take 1 tablet (5 mg total) by mouth daily.     Allergies:   Clindamycin, Levaquin [levofloxacin], Lisinopril, Tru-micin [misc natural products],  and Erythromycin   ROS:   Please see the history of present illness.    All other systems reviewed and are negative.  EKGs/Labs/Other Studies Reviewed:    The following studies were reviewed today: Cardiac Studies & Procedures      ECHOCARDIOGRAM  ECHOCARDIOGRAM COMPLETE 12/01/2021  Narrative ECHOCARDIOGRAM REPORT    Patient Name:   [name] [name] Date of Exam: 12/01/2021 Medical Rec #:  [id number]      Height:       67.0 in Accession #:    [id number]     Weight:       157.0 lb Date of Birth:  08-22-1945       BSA:          1.825 m Patient Age:    76 years       BP:           126/78 mmHg Patient Gender: F              HR:           65 bpm. Exam Location:  [address]  Procedure: 2D Echo, Cardiac Doppler and Color Doppler  Indications:    R06.02 SOB  History:        Patient has prior history of Echocardiogram examinations, most recent 10/01/2008. Mitral Valve Prolapse, Arrythmias:PAC; Risk Factors:Hypertension. Dyspnea on exertion. Asthma. Palpitations.  Sonographer:    Jon Hacker RCS Referring Phys: [phone] Kimisha Eunice  IMPRESSIONS   1. Left ventricular ejection fraction, by estimation, is 60 to 65%. The left ventricle has normal function. The left  ventricle has no regional wall motion abnormalities. Left ventricular diastolic parameters are consistent with Grade I diastolic dysfunction (impaired relaxation). 2. Right ventricular systolic function is normal. The right ventricular size is normal. 3. The mitral valve is myxomatous. Mild mitral valve regurgitation. No evidence of mitral stenosis. There is mild holosystolic prolapse of the middle scallop of the posterior leaflet of the mitral valve. 4. The aortic valve is tricuspid. There is mild calcification of the aortic valve. Aortic valve regurgitation is not visualized. Aortic valve sclerosis is present, with no evidence of aortic valve stenosis. 5. The inferior vena cava is normal in size with greater than 50%  respiratory variability, suggesting right atrial pressure of 3 mmHg.  FINDINGS Left Ventricle: Left ventricular ejection fraction, by estimation, is 60 to 65%. The left ventricle has normal function. The left ventricle has no regional wall motion abnormalities. The left ventricular internal cavity size was normal in size. There is no left ventricular hypertrophy. Left ventricular diastolic parameters are consistent with Grade I diastolic dysfunction (impaired relaxation).  Right Ventricle: The right ventricular size is normal. No increase in right ventricular wall thickness. Right ventricular systolic function is normal.  Left Atrium: Left atrial size was normal in size.  Right Atrium: Right atrial size was normal in size.  Pericardium: There is no evidence of pericardial effusion.  Mitral Valve: The mitral valve is myxomatous. There is mild holosystolic prolapse of the middle scallop of the posterior leaflet of the mitral valve. Mild mitral annular calcification. Mild mitral valve regurgitation. No evidence of mitral valve stenosis.  Tricuspid Valve: The tricuspid valve is normal in structure. Tricuspid valve regurgitation is trivial. No evidence of tricuspid stenosis.  Aortic Valve: The aortic valve is tricuspid. There is mild calcification of the aortic valve. Aortic valve regurgitation is not visualized. Aortic valve sclerosis is present, with no evidence of aortic valve stenosis.  Pulmonic Valve: The pulmonic valve was normal in structure. Pulmonic valve regurgitation is trivial. No evidence of pulmonic stenosis.  Aorta: The aortic root is normal in size and structure.  Venous: The inferior vena cava is normal in size with greater than 50% respiratory variability, suggesting right atrial pressure of 3 mmHg.  IAS/Shunts: No atrial level shunt detected by color flow Doppler.   LEFT VENTRICLE PLAX 2D LVIDd:         3.80 cm   Diastology LVIDs:         2.30 cm   LV e' medial:    8.05  cm/s LV PW:         1.10 cm   LV E/e' medial:  10.0 LV IVS:        1.50 cm   LV e' lateral:   8.38 cm/s LVOT diam:     1.90 cm   LV E/e' lateral: 9.6 LV SV:         80 LV SV Index:   44 LVOT Area:     2.84 cm   RIGHT VENTRICLE RV Basal diam:  2.60 cm RV S prime:     16.50 cm/s TAPSE (M-mode): 2.4 cm  LEFT ATRIUM             Index        RIGHT ATRIUM           Index LA diam:        3.90 cm 2.14 cm/m   RA Area:     11.10 cm LA Vol (A2C):   31.5 ml 17.26 ml/m  RA  Volume:   22.00 ml  12.06 ml/m LA Vol (A4C):   34.9 ml 19.13 ml/m LA Biplane Vol: 34.2 ml 18.74 ml/m AORTIC VALVE LVOT Vmax:   129.00 cm/s LVOT Vmean:  77.000 cm/s LVOT VTI:    0.281 m  AORTA Ao Root diam: 3.10 cm Ao Asc diam:  3.40 cm  MITRAL VALVE MV Area (PHT): 3.08 cm     SHUNTS MV Decel Time: 246 msec     Systemic VTI:  0.28 m MV E velocity: 80.70 cm/s   Systemic Diam: 1.90 cm MV A velocity: 101.00 cm/s MV E/A ratio:  0.80  Oneil Parchment MD Electronically signed by Oneil Parchment MD Signature Date/Time: 12/01/2021/3:40:35 PM    Final             EKG:   EKG Interpretation Date/Time:  Monday January 29 2023 16:20:27 EST Ventricular Rate:  71 PR Interval:  156 QRS Duration:  80 QT Interval:  392 QTC Calculation: 425 R Axis:   65  Text Interpretation: Normal sinus rhythm with sinus arrhythmia Nonspecific ST abnormality When compared with ECG of 03-Aug-2019 10:50, No significant change was found Confirmed by Wonda Sharper [phone]) on 01/29/2023 4:25:00 PM    Recent Labs: 06/06/2022: ALT 15; BUN 33; Creatinine, Ser 1.32; Hemoglobin 13.1; Magnesium 2.3; Platelets 255; Potassium 4.7; Sodium 146; TSH 1.620  Recent Lipid Panel    Component Value Date/Time   CHOL 267 (H) 06/06/2022 0838   TRIG 111 06/06/2022 0838   HDL 60 06/06/2022 0838   CHOLHDL 4.5 (H) 06/06/2022 0838   LDLCALC 187 (H) 06/06/2022 9161           Physical Exam:    VS:  BP (!) 130/90   Pulse 71   Ht 5' 7 (1.702 m)   Wt  156 lb 6.4 oz (70.9 kg)   SpO2 99%   BMI 24.50 kg/m     Wt Readings from Last 3 Encounters:  01/29/23 156 lb 6.4 oz (70.9 kg)  08/08/22 147 lb 14.4 oz (67.1 kg)  06/06/22 147 lb (66.7 kg)     GEN:  Well nourished, well developed in no acute distress HEENT: Normal NECK: No JVD; No carotid bruits LYMPHATICS: No lymphadenopathy CARDIAC: RRR, no murmurs, rubs, gallops RESPIRATORY:  Clear to auscultation without rales, wheezing or rhonchi  ABDOMEN: Soft, non-tender, non-distended MUSCULOSKELETAL:  No edema; No deformity  SKIN: Warm and dry NEUROLOGIC:  Alert and oriented x 3 PSYCHIATRIC:  Normal affect   Assessment & Plan Essential hypertension Blood pressure control is suboptimal.  Increase carvedilol  to 6.25 mg twice daily.  Continue hydrochlorothiazide .  Check metabolic panel today.  Last creatinine was 1.32.  Will need to follow this with serial lab tests and make sure she stays adequately hydrated. MITRAL VALVE PROLAPSE No murmur on exam.  Continue clinical follow-up. Hyperlipidemia, unspecified hyperlipidemia type Last LDL cholesterol was 187.  She will start rosuvastatin  5 mg daily.  She is counseled about her approach to medication, concerns over side effects, and risk/benefit of lipid-lowering therapies.  She should have a follow-up lipid panel and LFTs checked in a few months.            Medication Adjustments/Labs and Tests Ordered: Current medicines are reviewed at length with the patient today.  Concerns regarding medicines are outlined above.  Orders Placed This Encounter  Procedures   Comprehensive metabolic panel   EKG 12-Lead   Meds ordered this encounter  Medications   carvedilol  (COREG ) 6.25 MG tablet  Sig: Take 1 tablet (6.25 mg total) by mouth 2 (two) times daily.    Dispense:  180 tablet    Refill:  3    Dose INCREASE    Patient Instructions  Medication Instructions:  INCREASE Carvedilol /Coreg  to 6.25mg  twice daily *If you need a refill on  your cardiac medications before your next appointment, please call your pharmacy*   Lab Work: CMET today If you have labs (blood work) drawn today and your tests are completely normal, you will receive your results only by: MyChart Message (if you have MyChart) OR A paper copy in the mail If you have any lab test that is abnormal or we need to change your treatment, we will call you to review the results.   Testing/Procedures: **Needs pharmD appt for HTN/lipids**   Follow-Up: At [facility], you and your health needs are our priority.  As part of our continuing mission to provide you with exceptional heart care, we have created designated Provider Care Teams.  These Care Teams include your primary Cardiologist (physician) and Advanced Practice Providers (APPs -  Physician Assistants and Nurse Practitioners) who all work together to provide you with the care you need, when you need it.  Your next appointment:   1 year(s)  Provider:   Ozell Fell, MD         Signed, Ozell Fell, MD  01/29/2023 5:34 PM    [facility]

## 2023-01-29 NOTE — Patient Instructions (Signed)
 Medication Instructions:  INCREASE Carvedilol /Coreg  to 6.25mg  twice daily *If you need a refill on your cardiac medications before your next appointment, please call your pharmacy*   Lab Work: CMET today If you have labs (blood work) drawn today and your tests are completely normal, you will receive your results only by: MyChart Message (if you have MyChart) OR A paper copy in the mail If you have any lab test that is abnormal or we need to change your treatment, we will call you to review the results.   Testing/Procedures: **Needs pharmD appt for HTN/lipids**   Follow-Up: At [facility], you and your health needs are our priority.  As part of our continuing mission to provide you with exceptional heart care, we have created designated Provider Care Teams.  These Care Teams include your primary Cardiologist (physician) and Advanced Practice Providers (APPs -  Physician Assistants and Nurse Practitioners) who all work together to provide you with the care you need, when you need it.  Your next appointment:   1 year(s)  Provider:   Ozell Fell, MD

## 2023-01-30 LAB — COMPREHENSIVE METABOLIC PANEL
ALT: 17 [IU]/L (ref 0–32)
AST: 22 [IU]/L (ref 0–40)
Albumin: 4.4 g/dL (ref 3.8–4.8)
Alkaline Phosphatase: 59 [IU]/L (ref 44–121)
BUN/Creatinine Ratio: 27 (ref 12–28)
BUN: 32 mg/dL — ABNORMAL HIGH (ref 8–27)
Bilirubin Total: 0.2 mg/dL (ref 0.0–1.2)
CO2: 28 mmol/L (ref 20–29)
Calcium: 9.6 mg/dL (ref 8.7–10.3)
Chloride: 102 mmol/L (ref 96–106)
Creatinine, Ser: 1.19 mg/dL — ABNORMAL HIGH (ref 0.57–1.00)
Globulin, Total: 2.2 g/dL (ref 1.5–4.5)
Glucose: 103 mg/dL — ABNORMAL HIGH (ref 70–99)
Potassium: 4.3 mmol/L (ref 3.5–5.2)
Sodium: 141 mmol/L (ref 134–144)
Total Protein: 6.6 g/dL (ref 6.0–8.5)
eGFR: 47 mL/min/{1.73_m2} — ABNORMAL LOW (ref >59)

## 2023-02-20 DIAGNOSIS — M4316 Spondylolisthesis, lumbar region: Secondary | ICD-10-CM | POA: Diagnosis not present

## 2023-03-29 ENCOUNTER — Telehealth: Payer: Self-pay | Admitting: Cardiovascular Disease

## 2023-03-29 DIAGNOSIS — R0609 Other forms of dyspnea: Secondary | ICD-10-CM

## 2023-03-29 DIAGNOSIS — I10 Essential (primary) hypertension: Secondary | ICD-10-CM

## 2023-03-29 MED ORDER — HYDROCHLOROTHIAZIDE 12.5 MG PO CAPS: 12.5000 mg | ORAL_CAPSULE | Freq: Every day | ORAL | 3 refills | Status: DC

## 2023-03-29 NOTE — Telephone Encounter (Signed)
 Pt's medication was sent to pt's pharmacy as requested. Confirmation received.

## 2023-03-29 NOTE — Telephone Encounter (Signed)
*  STAT* If patient is at the pharmacy, call can be transferred to refill team.   1. Which medications need to be refilled? (please list name of each medication and dose if known)  hydrochlorothiazide (MICROZIDE) 12.5 MG capsule  2. Which pharmacy/location (including street and city if local pharmacy) is medication to be sent to? [id number] - [place], [place] - [address] Phone: [phone]  Fax: [phone]     3. Do they need a 30 day or 90 day supply? 90

## 2023-06-19 ENCOUNTER — Ambulatory Visit: Admitting: Pharmacist

## 2023-07-04 DIAGNOSIS — Z1231 Encounter for screening mammogram for malignant neoplasm of breast: Secondary | ICD-10-CM | POA: Diagnosis not present

## 2023-07-09 DIAGNOSIS — Z01419 Encounter for gynecological examination (general) (routine) without abnormal findings: Secondary | ICD-10-CM | POA: Diagnosis not present

## 2023-07-09 DIAGNOSIS — Z6826 Body mass index (BMI) 26.0-26.9, adult: Secondary | ICD-10-CM | POA: Diagnosis not present

## 2023-07-20 ENCOUNTER — Ambulatory Visit: Admitting: Family Medicine

## 2023-07-20 ENCOUNTER — Encounter: Payer: Self-pay | Admitting: Family Medicine

## 2023-07-20 VITALS — BP 170/90 | HR 75 | Temp 98.1°F | Ht 67.0 in | Wt 158.0 lb

## 2023-07-20 DIAGNOSIS — K2100 Gastro-esophageal reflux disease with esophagitis, without bleeding: Secondary | ICD-10-CM

## 2023-07-20 DIAGNOSIS — J4520 Mild intermittent asthma, uncomplicated: Secondary | ICD-10-CM | POA: Diagnosis not present

## 2023-07-20 DIAGNOSIS — J301 Allergic rhinitis due to pollen: Secondary | ICD-10-CM

## 2023-07-20 DIAGNOSIS — E782 Mixed hyperlipidemia: Secondary | ICD-10-CM | POA: Diagnosis not present

## 2023-07-20 DIAGNOSIS — I341 Nonrheumatic mitral (valve) prolapse: Secondary | ICD-10-CM

## 2023-07-20 DIAGNOSIS — I10 Essential (primary) hypertension: Secondary | ICD-10-CM

## 2023-07-20 DIAGNOSIS — M791 Myalgia, unspecified site: Secondary | ICD-10-CM

## 2023-07-20 MED ORDER — MONTELUKAST SODIUM 10 MG PO TABS: 10.0000 mg | ORAL_TABLET | Freq: Every day | ORAL | 3 refills | Status: DC

## 2023-07-20 MED ORDER — ALBUTEROL SULFATE HFA 108 (90 BASE) MCG/ACT IN AERS
2.0000 | INHALATION_SPRAY | Freq: Four times a day (QID) | RESPIRATORY_TRACT | 0 refills | Status: AC | PRN
Start: 2023-07-20 — End: ?

## 2023-07-20 NOTE — Progress Notes (Unsigned)
 Subjective:  Patient ID: [name] [name], female    DOB: 1945/02/01  Age: 78 y.o. MRN: [id number]  Chief Complaint  Patient presents with   Medical Management of Chronic Issues   Discussed the use of AI scribe software for clinical note transcription with the patient, who gave verbal consent to proceed.  History of Present Illness   [name] [name] is a 78 year old female with hypertension and mitral valve prolapse who presents with fatigue and elevated blood pressure. She is accompanied by her husband.  Fatigue and malaise - Fatigue and lack of energy for the past week, worsening since Sunday - No recent changes in activity level aside from symptoms - No chest pain, palpitations, or syncope  Blood pressure fluctuations - Elevated blood pressure over the past week, with readings as high as 161/86 mmHg and as low as 119 mmHg in the morning - Did not take blood pressure medication on the morning of the visit due to planned blood work - Current antihypertensive regimen: carvedilol  6.25 mg twice daily, hydrochlorothiazide  once daily - Carvedilol  dosage increased by cardiologist in January  Gastrointestinal symptoms - Diarrhea, most severe on Monday with five episodes, now improved  Respiratory symptoms - Runny nose and nasal congestion, attributed to allergies - Occasional shortness of breath, particularly when gardening in hot weather - History of asthma, rarely uses albuterol  inhaler except during colds - Used albuterol  inhaler this week with some improvement in symptoms  Muscle cramps - Nocturnal muscle cramps,  she suspected to be related to medication regimen  Allergic rhinitis - History of allergies causing nasal congestion and runny nose - Occasionally takes Claritin for symptoms, not used consistently           07/20/2023    9:47 AM 08/08/2022   10:12 AM 06/06/2022    8:00 AM 11/30/2021    9:00 AM 08/12/2019    2:00 PM  Depression screen PHQ 2/9  Decreased Interest 0 0  0 0 0  Down, Depressed, Hopeless 0 0 0 0 0  PHQ - 2 Score 0 0 0 0 0        07/20/2023    9:51 AM  Fall Risk   Falls in the past year? 0  Number falls in past yr: 0  Injury with Fall? 0  Risk for fall due to : No Fall Risks  Follow up Falls evaluation completed    Patient Care Team: Sherre Clapper, MD as PCP - General (Family Medicine) [facility] (Gynecology) Mat Browning, MD as Consulting Physician (Obstetrics and Gynecology) Wonda Sharper, MD as Consulting Physician (Cardiology) Tobie Robbi LABOR, MD Lloyd Savannah, MD as Referring Physician (Dermatology)   Review of Systems  Constitutional:  Positive for fatigue. Negative for chills and fever.  HENT:  Positive for rhinorrhea. Negative for congestion, ear pain and sore throat.   Respiratory:  Positive for cough, shortness of breath and wheezing.   Cardiovascular:  Negative for chest pain.  Gastrointestinal:  Positive for diarrhea (5 times monday. improved.). Negative for abdominal pain, constipation, nausea and vomiting.  Genitourinary:  Negative for dysuria and urgency.  Musculoskeletal:  Negative for back pain and myalgias.  Neurological:  Negative for dizziness, weakness, light-headedness and headaches.  Psychiatric/Behavioral:  Negative for dysphoric mood. The patient is not nervous/anxious.     Current Outpatient Medications on File Prior to Visit  Medication Sig Dispense Refill   carvedilol  (COREG ) 6.25 MG tablet Take 1 tablet (6.25 mg total) by mouth  2 (two) times daily. 180 tablet 3   hydrochlorothiazide  (MICROZIDE ) 12.5 MG capsule Take 1 capsule (12.5 mg total) by mouth daily. 90 capsule 3   No current facility-administered medications on file prior to visit.   Past Medical History:  Diagnosis Date   Allergic rhinitis due to pollen    CONSTIPATION    Essential hypertension 09/21/2008   Qualifier: Diagnosis of  By: Gwenn Grimes     GERD    MITRAL VALVE PROLAPSE    Nonrheumatic  mitral (valve) prolapse    PREMATURE ATRIAL CONTRACTIONS    Solitary pulmonary nodule    Past Surgical History:  Procedure Laterality Date   BACK SURGERY     CATARACT EXTRACTION, BILATERAL Bilateral 2024   LAPAROSCOPIC HYSTERECTOMY      Family History  Problem Relation Age of Onset   Hypertension Mother    Heart disease Mother    Osteoporosis Mother    Thyroid disease Mother    Parkinson's disease Father    Thyroid disease Sister    Heart disease Other    Social History   Socioeconomic History   Marital status: Married    Spouse name: JW   Number of children: 2   Years of education: Not on file   Highest education level: Not on file  Occupational History   Occupation: Retired Programmer, systems  Tobacco Use   Smoking status: Never   Smokeless tobacco: Never  Substance and Sexual Activity   Alcohol use: No   Drug use: Never   Sexual activity: Not Currently  Other Topics Concern   Not on file  Social History Narrative   Not on file   Social Drivers of Health   Financial Resource Strain: Low Risk  (07/20/2023)   Overall Financial Resource Strain (CARDIA)    Difficulty of Paying Living Expenses: Not hard at all  Food Insecurity: No Food Insecurity (07/20/2023)   Hunger Vital Sign    Worried About Running Out of Food in the Last Year: Never true    Ran Out of Food in the Last Year: Never true  Transportation Needs: No Transportation Needs (07/20/2023)   PRAPARE - Administrator, Civil Service (Medical): No    Lack of Transportation (Non-Medical): No  Physical Activity: Insufficiently Active (07/20/2023)   Exercise Vital Sign    Days of Exercise per Week: 3 days    Minutes of Exercise per Session: 30 min  Stress: No Stress Concern Present (07/20/2023)   [employer] of Occupational Health - Occupational Stress Questionnaire    Feeling of Stress: Not at all  Social Connections: Moderately Integrated (07/20/2023)   Social Connection and Isolation Panel     Frequency of Communication with Friends and Family: Three times a week    Frequency of Social Gatherings with Friends and Family: Three times a week    Attends Religious Services: More than 4 times per year    Active Member of Clubs or Organizations: No    Attends Banker Meetings: Never    Marital Status: Married    Objective:  BP (!) 170/90   Pulse 75   Temp 98.1 F (36.7 C)   Ht 5' 7 (1.702 m)   Wt 158 lb (71.7 kg)   SpO2 96%   BMI 24.75 kg/m      07/20/2023   10:45 AM 07/20/2023    9:43 AM 01/29/2023    4:16 PM  BP/Weight  Systolic BP 170 178 130  Diastolic BP 90 82  90  Wt. (Lbs)  158 156.4  BMI  24.75 kg/m2 24.5 kg/m2    Physical Exam Vitals reviewed.  Constitutional:      Appearance: Normal appearance. She is normal weight.  HENT:     Right Ear: Tympanic membrane normal.     Left Ear: Tympanic membrane normal.     Nose: Congestion and rhinorrhea present.     Mouth/Throat:     Pharynx: No oropharyngeal exudate or posterior oropharyngeal erythema.  Neck:     Vascular: No carotid bruit.   Cardiovascular:     Rate and Rhythm: Normal rate and regular rhythm.     Heart sounds: Normal heart sounds.  Pulmonary:     Effort: Pulmonary effort is normal. No respiratory distress.     Breath sounds: Normal breath sounds.  Abdominal:     General: Abdomen is flat. Bowel sounds are normal.     Palpations: Abdomen is soft.     Tenderness: There is no abdominal tenderness.   Neurological:     Mental Status: She is alert and oriented to person, place, and time.   Psychiatric:        Mood and Affect: Mood normal.        Behavior: Behavior normal.         Lab Results  Component Value Date   WBC 7.9 06/06/2022   HGB 13.1 06/06/2022   HCT 38.4 06/06/2022   PLT 255 06/06/2022   GLUCOSE 103 (H) 01/29/2023   CHOL 267 (H) 06/06/2022   TRIG 111 06/06/2022   HDL 60 06/06/2022   LDLCALC 187 (H) 06/06/2022   ALT 17 01/29/2023   AST 22 01/29/2023   NA  141 01/29/2023   K 4.3 01/29/2023   CL 102 01/29/2023   CREATININE 1.19 (H) 01/29/2023   BUN 32 (H) 01/29/2023   CO2 28 01/29/2023   TSH 1.620 06/06/2022   HGBA1C 5.5 02/02/2016      Assessment & Plan:  Essential hypertension Assessment & Plan: Poorly controlled. Patient to return in the afternoon on 07/24/2023 for a nurse visit for her bp with her bp cuff for comparison. I would anticipate an increase in her bp medicines.  Taking Carvedilol  6.25 mg twice daily and HYDROCHLOROTHIAZIDE  12.5 mg daily. Continue to work on eating a healthy diet and exercise.  Labs drawn today.    Orders: -     CBC with Differential/Platelet -     Comprehensive metabolic panel with GFR  Mixed hyperlipidemia Assessment & Plan: LDL high in the past. Patient has never tried chol medicines. She is scared of medicines overall.  Check labs.  Recommend healthy diet and exercise.   Orders: -     Lipid panel -     TSH  Myalgia Assessment & Plan: Check labs. Recommend increase hydration  Orders: -     B12 and Folate Panel -     Methylmalonic acid, serum -     Magnesium  Mild intermittent asthma, uncomplicated Assessment & Plan: Albuterol  as needed. Sparingly.  Start on singulair 10 mg daily    Seasonal allergic rhinitis due to pollen Assessment & Plan: Recommend take claritin once daily.  Start on singulair 10 mg daily.    Nonrheumatic mitral (valve) prolapse Assessment & Plan: Recommend continued routine follow up with cardiology.    Other orders -     Montelukast Sodium; Take 1 tablet (10 mg total) by mouth at bedtime.  Dispense: 30 tablet; Refill: 3 -     Albuterol   Sulfate HFA; Inhale 2 puffs into the lungs every 6 (six) hours as needed for wheezing or shortness of breath.  Dispense: 8 g; Refill: 0     Meds ordered this encounter  Medications   montelukast (SINGULAIR) 10 MG tablet    Sig: Take 1 tablet (10 mg total) by mouth at bedtime.    Dispense:  30 tablet    Refill:  3    albuterol  (VENTOLIN  HFA) 108 (90 Base) MCG/ACT inhaler    Sig: Inhale 2 puffs into the lungs every 6 (six) hours as needed for wheezing or shortness of breath.    Dispense:  8 g    Refill:  0    Orders Placed This Encounter  Procedures   CBC with Differential/Platelet   Comprehensive metabolic panel with GFR   Lipid panel   TSH   B12 and Folate Panel   Methylmalonic acid, serum   Magnesium     Follow-up: Return in about 4 days (around 07/24/2023) for  nurse visit for bp check. schedule to follow up awv with me in approximately 6 weeks. SABRA LILLETTE Bowling A Bramblett,acting as a scribe for Abigail Free, MD.,have documented all relevant documentation on the behalf of Abigail Free, MD,as directed by  Abigail Free, MD while in the presence of Abigail Free, MD.   LILLETTE Kato I Leal-Borjas,acting as a scribe for Abigail Free, MD.,have documented all relevant documentation on the behalf of Abigail Free, MD,as directed by  Abigail Free, MD while in the presence of Abigail Free, MD.    An After Visit Summary was printed and given to the patient.  Abigail Free, MD [facility] [phone]

## 2023-07-22 DIAGNOSIS — K2100 Gastro-esophageal reflux disease with esophagitis, without bleeding: Secondary | ICD-10-CM | POA: Insufficient documentation

## 2023-07-22 NOTE — Assessment & Plan Note (Addendum)
 Albuterol  as needed. Sparingly.  Start on singulair 10 mg daily

## 2023-07-22 NOTE — Assessment & Plan Note (Addendum)
 LDL high in the past. Patient has never tried chol medicines. She is scared of medicines overall.  Check labs.  Recommend healthy diet and exercise.

## 2023-07-22 NOTE — Assessment & Plan Note (Deleted)
 Stable

## 2023-07-22 NOTE — Assessment & Plan Note (Signed)
 Recommend continued routine follow up with cardiology.

## 2023-07-22 NOTE — Assessment & Plan Note (Addendum)
 Check labs. Recommend increase hydration

## 2023-07-22 NOTE — Assessment & Plan Note (Addendum)
 Poorly controlled. Patient to return in the afternoon on 07/24/2023 for a nurse visit for her bp with her bp cuff for comparison. I would anticipate an increase in her bp medicines.  Taking Carvedilol  6.25 mg twice daily and HYDROCHLOROTHIAZIDE  12.5 mg daily. Continue to work on eating a healthy diet and exercise.  Labs drawn today.

## 2023-07-22 NOTE — Assessment & Plan Note (Signed)
 Recommend take claritin once daily.  Start on singulair 10 mg daily.

## 2023-07-24 ENCOUNTER — Ambulatory Visit

## 2023-07-24 ENCOUNTER — Ambulatory Visit: Payer: Self-pay | Admitting: Family Medicine

## 2023-07-24 LAB — COMPREHENSIVE METABOLIC PANEL WITH GFR
ALT: 13 IU/L (ref 0–32)
AST: 24 IU/L (ref 0–40)
Albumin: 4.3 g/dL (ref 3.8–4.8)
Alkaline Phosphatase: 68 IU/L (ref 44–121)
BUN/Creatinine Ratio: 22 (ref 12–28)
BUN: 28 mg/dL — ABNORMAL HIGH (ref 8–27)
Bilirubin Total: 0.5 mg/dL (ref 0.0–1.2)
CO2: 22 mmol/L (ref 20–29)
Calcium: 10 mg/dL (ref 8.7–10.3)
Chloride: 102 mmol/L (ref 96–106)
Creatinine, Ser: 1.28 mg/dL — ABNORMAL HIGH (ref 0.57–1.00)
Globulin, Total: 2.5 g/dL (ref 1.5–4.5)
Glucose: 98 mg/dL (ref 70–99)
Potassium: 4.4 mmol/L (ref 3.5–5.2)
Sodium: 142 mmol/L (ref 134–144)
Total Protein: 6.8 g/dL (ref 6.0–8.5)
eGFR: 43 mL/min/{1.73_m2} — ABNORMAL LOW (ref >59)

## 2023-07-24 LAB — LIPID PANEL
Chol/HDL Ratio: 4.4 ratio (ref 0.0–4.4)
Cholesterol, Total: 270 mg/dL — ABNORMAL HIGH (ref 100–199)
HDL: 62 mg/dL (ref >39)
LDL Chol Calc (NIH): 188 mg/dL — ABNORMAL HIGH (ref 0–99)
Triglycerides: 116 mg/dL (ref 0–149)
VLDL Cholesterol Cal: 20 mg/dL (ref 5–40)

## 2023-07-24 LAB — CBC WITH DIFFERENTIAL/PLATELET
Basophils Absolute: 0.1 10*3/uL (ref 0.0–0.2)
Basos: 1 %
EOS (ABSOLUTE): 0.1 10*3/uL (ref 0.0–0.4)
Eos: 2 %
Hematocrit: 38.2 % (ref 34.0–46.6)
Hemoglobin: 12.5 g/dL (ref 11.1–15.9)
Immature Grans (Abs): 0 10*3/uL (ref 0.0–0.1)
Immature Granulocytes: 0 %
Lymphocytes Absolute: 1.5 10*3/uL (ref 0.7–3.1)
Lymphs: 17 %
MCH: 30.3 pg (ref 26.6–33.0)
MCHC: 32.7 g/dL (ref 31.5–35.7)
MCV: 93 fL (ref 79–97)
Monocytes Absolute: 0.5 10*3/uL (ref 0.1–0.9)
Monocytes: 6 %
Neutrophils Absolute: 6.2 10*3/uL (ref 1.4–7.0)
Neutrophils: 73 %
Platelets: 225 10*3/uL (ref 150–450)
RBC: 4.12 x10E6/uL (ref 3.77–5.28)
RDW: 12.1 % (ref 11.7–15.4)
WBC: 8.5 10*3/uL (ref 3.4–10.8)

## 2023-07-24 LAB — B12 AND FOLATE PANEL
Folate: >20 ng/mL (ref >3.0)
Vitamin B-12: 473 pg/mL (ref 232–1245)

## 2023-07-24 LAB — TSH: TSH: 1.34 u[IU]/mL (ref 0.450–4.500)

## 2023-07-24 LAB — METHYLMALONIC ACID, SERUM: Methylmalonic Acid: 280 nmol/L (ref 0–378)

## 2023-07-24 LAB — MAGNESIUM: Magnesium: 2.3 mg/dL (ref 1.6–2.3)

## 2023-07-24 NOTE — Progress Notes (Signed)
 Patient presents today for nurse visit for her bp was advised to bring her bp cuff for comparison. Taking Carvedilol  6.25 mg twice daily and HYDROCHLOROTHIAZIDE  12.5 mg daily. Bp was still high so discussed with Dr. Sherre and per provider patient is to discontinue hydrochlorothiazide  and start Valsartan/hydrochlorothiazide  80/12.5mg  once daily. Then patient is following up with cardiology and also already scheduled to follow up with Dr. Sherre in 6 weeks. Patient was informed and she stated that she would like to think about it because she is seeing the pharmacist that the cardiologist recommend that she see about her BP medications and that they would be changing them so wanted to wait. She stated that she was going to continue the same regimen of Carvedilol  6.25 mg twice daily and hydrochlorothiazide  12.5 mg daily until her pharmacy appointment. Dr. Sherre was informed of patient's wishes.

## 2023-07-30 ENCOUNTER — Ambulatory Visit: Payer: Medicare PPO

## 2023-08-06 ENCOUNTER — Ambulatory Visit: Attending: Cardiovascular Disease | Admitting: Pharmacist

## 2023-08-06 VITALS — BP 178/90 | HR 59

## 2023-08-06 DIAGNOSIS — I10 Essential (primary) hypertension: Secondary | ICD-10-CM | POA: Diagnosis not present

## 2023-08-06 DIAGNOSIS — E782 Mixed hyperlipidemia: Secondary | ICD-10-CM

## 2023-08-06 NOTE — Assessment & Plan Note (Signed)
 Assessment: Blood pressure elevated today in clinic.  She has known whitecoat hypertension Her home cuff has been validated and her home readings are much better than clinic reading Her blood pressure is most certainly driven by anxiety and part of that anxiety and side effects from medications She complains about tiring easily but admits that oftentimes after she does exercise she feels a lot better and more energy She and her husband used to walk many miles every day prior to COVID.  I strongly encouraged her to resume walking on a regular basis I did talk about the rationale behind Dr. Stacy suggestion to start valsartan/hydrochlorothiazide .  Patient very resistant to any changes.  At this point I think it is reasonable to leave medication as it is and we will reevaluate in a few weeks.   Plan Continue carvedilol  6.25 mg twice a day + HCTZ 12.5 mg daily Start walking on a regular basis Follow-up in 2 to 3 weeks with blood pressure readings.  Patient to send via MyChart

## 2023-08-06 NOTE — Progress Notes (Signed)
 Patient ID: [name] [name]                 DOB: 05-Jun-1945                      MRN: [id number]      HPI: [name] [name] is a 78 y.o. female referred by Dr. Wonda to HTN clinic. PMH is significant for hypertension, history of white coat hypertension mitral valve prolapse, and heart palpitations.  On 11/18/2021, patient was having more trouble with her blood pressure with elevated readings. She is having fatigue and shortness of breath with activity. She does not like taking carvedilol , but has remained compliant, but feels cause of fatigue. Dr. Wonda added HCTZ 12.5 mg to her regimen on 10/27.  11/8: Had appointment with Dr. Abran with home blood pressures ranging in 110-176 systolic and 60-80 diastolic. In office bp was 126/78 and labs were done since starting HCTZ 12.5 mg  Visit with Pharm D on 11/30/21 no medication changes were made. No changes were made on 02/22/22 as well since pt home BP cuff was found to be inaccurate. Patient resistant to starting statin.   Last seen by me in office on 05/04/22. She brought in new home BP cuff which was accurate. Home blood pressures variable but mostly out of goal. Patient refused any medication changes. She was encouraged to start rosuvastatin  her PCP was prescribing via my chart in May 2024. She never did start the statin. She saw Dr. Wonda in Jan 2025. Carvedilol  was increased to 6.25mg  twice a day. LDL-C was 188 on 07/20/23.  Patient presents today for follow-up accompanied by her husband.  She brings in blood pressure readings ranging from March to the end of June.  Overall they are fairly well-controlled.  Patient has had significant fear of taking medications.  I certainly think anxiety plays a role in her blood pressure.  Her husband states she is always worried about things.  Home readings 148/78 HR 66 135/82 HR 60 138/83 HR 62 143/74 HR  63 118/66 161/86 149/78 119/68 131/74 122/73 129/70 112/69 121/68 148/80 148/89 126/75 131/69 129/73 119/75 127/76 125/75 134/79 130/73 147/79 130/74    Current HTN meds: carvedilol  6.25 mg twice a day + HCTZ 12.5 mg daily Previously tried: metoprolol - tiredness, lisinopril- Hives  losartan -tiredness, amlodipine  (jittery) BP goal: <130/80  Family History:  Family History  Problem Relation Age of Onset   Heart disease Other    Hypertension Mother    Parkinson's disease Father      Social History:  reports that she has never smoked. She has never used smokeless tobacco. She reports that she does not drink alcohol and does not use drugs.   Diet: intermittent fasting 16 hours fasting  Tries to be mindful of what she is eating. Eats from th garden in the summer time. Admits to enjoying bread and butter, but knows to limit those foods. She eats mostly chicken and some red meat, does not put salt on food.   Exercise: none but willing to start 30 min walks with chair exercises 5 min per day   Home BP readings:   Office BP readings:   Wt Readings from Last 3 Encounters:  11/30/21 157 lb (71.2 kg)  11/18/21 158 lb 12.8 oz (72 kg)  04/26/21 161 lb (73 kg)   BP Readings from Last 3 Encounters:  12/01/21 (!) 160/82  11/30/21 126/78  11/18/21 (!) 170/98   Pulse Readings from  Last 3 Encounters:  11/30/21 72  11/18/21 66  04/26/21 78    Renal function: Estimated Creatinine Clearance: 37.3 mL/min (A) (by C-G formula based on SCr of 1.21 mg/dL (H)).  Past Medical History:  Diagnosis Date   Allergic rhinitis due to pollen    CONSTIPATION    Essential hypertension 09/21/2008   Qualifier: Diagnosis of  By: Gwenn Grimes     GERD    MITRAL VALVE PROLAPSE    Nonrheumatic mitral (valve) prolapse    PREMATURE ATRIAL CONTRACTIONS    Solitary pulmonary nodule     Current Outpatient Medications on File Prior to Visit  Medication Sig Dispense Refill    carvedilol  (COREG ) 3.125 MG tablet Take 1 tablet (3.125 mg total) by mouth 2 (two) times daily. 60 tablet 4   hydrochlorothiazide  (MICROZIDE ) 12.5 MG capsule Take 1 capsule (12.5 mg total) by mouth daily. 30 capsule 11   No current facility-administered medications on file prior to visit.   BMET    Component Value Date/Time   NA 143 11/30/2021 1015   K 4.2 11/30/2021 1015   CL 104 11/30/2021 1015   CO2 23 11/30/2021 1015   GLUCOSE 92 11/30/2021 1015   GLUCOSE 107 (H) 08/03/2019 1050   BUN 30 (H) 11/30/2021 1015   CREATININE 1.21 (H) 11/30/2021 1015   CALCIUM  9.4 11/30/2021 1015   EGFR 46 (L) 11/30/2021 1015   GFRNONAA 52 (L) 03/11/2020 0900    Allergies  Allergen Reactions   Clindamycin    Levaquin [Levofloxacin] Itching   Lisinopril Hives   Tru-Micin [Misc Natural Products] Other (See Comments)   Erythromycin Swelling and Rash     Assessment/Plan: Essential hypertension Assessment: Blood pressure elevated today in clinic.  She has known whitecoat hypertension Her home cuff has been validated and her home readings are much better than clinic reading Her blood pressure is most certainly driven by anxiety and part of that anxiety and side effects from medications She complains about tiring easily but admits that oftentimes after she does exercise she feels a lot better and more energy She and her husband used to walk many miles every day prior to COVID.  I strongly encouraged her to resume walking on a regular basis I did talk about the rationale behind Dr. Stacy suggestion to start valsartan/hydrochlorothiazide .  Patient very resistant to any changes.  At this point I think it is reasonable to leave medication as it is and we will reevaluate in a few weeks.   Plan Continue carvedilol  6.25 mg twice a day + HCTZ 12.5 mg daily Start walking on a regular basis Follow-up in 2 to 3 weeks with blood pressure readings.  Patient to send via  MyChart   Hyperlipidemia Assessment: LDL-C very elevated Patient has significant fear of medications especially cholesterol medications We talked about how you are always going to hear about the bad, the majority of the population tolerate statins just fine Talked about the increased risk of heart attack and stroke with prolonged elevated cholesterol and blood pressure Discussed starting with rosuvastatin  5 mg every other day which patient was agreeable to  Plan: Start rosuvastatin  5 mg every other day Follow-up in August with PCP and consideration of increasing dose   Thank you,  [name] [name], Pharm.[name] [name], CPP [facility] A Division of [facility] [address] [place], [place] [address]  Phone: [phone]; Fax: [phone]

## 2023-08-06 NOTE — Patient Instructions (Addendum)
 Your blood pressure goal is < 130/42mmHg   Start rosuvastatin  5mg  every other day  Start walking on a regular basis- start with every other day and work up to daily  Continue checking blood pressure at home Please send me readings on 7/30   Important lifestyle changes to control high blood pressure  Intervention  Effect on the BP   Weight loss Weight loss is one of the most effective lifestyle changes for controlling blood pressure. If you're overweight or obese, losing even a small amount of weight can help reduce blood pressure.    Blood pressure can decrease by 1 millimeter of mercury (mmHg) with each kilogram (about 2.2 pounds) of weight lost.   Exercise regularly As a general goal, aim for 30 minutes of moderate physical activity every day.    Regular physical activity can lower blood pressure by 5 - 8 mmHg.   Eat a healthy diet Eat a diet rich in whole grains, fruits, vegetables, lean meat, and low-fat dairy products. Limit processed foods, saturated fat, and sweets.    A heart-healthy diet can lower high blood pressure by 10 mmHg.   Reduce salt (sodium) in your diet Aim for 000mg  of sodium each day. Avoid deli meats, canned food, and frozen microwave meals which are high in sodium.     Limiting sodium can reduce blood pressure by 5 mmHg.   Limit alcohol One drink equals 12 ounces of beer, 5 ounces of wine, or 1.5 ounces of 80-proof liquor.    Limiting alcohol to < 1 drink a day for women or < 2 drinks a day for men can help lower blood pressure by about 4 mmHg.   To check your pressure at home you will need to:   Sit up in a chair, with feet flat on the floor and back supported. Do not cross your ankles or legs. Rest your left arm so that the cuff is about heart level. If the cuff goes on your upper arm, then just relax your arm on the table, arm of the chair, or your lap. If you have a wrist cuff, hold your wrist against your chest at heart level. Place the  cuff snugly around your arm, about 1 inch above the crease of your elbow. The cords should be inside the groove of your elbow.  Sit quietly, with the cuff in place, for about 5 minutes. Then press the power button to start a reading. Do not talk or move while the reading is taking place.  Record your readings on a sheet of paper. Although most cuffs have a memory, it is often easier to see a pattern developing when the numbers are all in front of you.  You can repeat the reading after 1-3 minutes if it is recommended.   Make sure your bladder is empty and you have not had caffeine or tobacco within the last 30 minutes   Always bring your blood pressure log with you to your appointments. If you have not brought your monitor in to be double checked for accuracy, please bring it to your next appointment.   You can find a list of validated (accurate) blood pressure cuffs at: validatebp.org

## 2023-08-06 NOTE — Assessment & Plan Note (Signed)
 Assessment: LDL-C very elevated Patient has significant fear of medications especially cholesterol medications We talked about how you are always going to hear about the bad, the majority of the population tolerate statins just fine Talked about the increased risk of heart attack and stroke with prolonged elevated cholesterol and blood pressure Discussed starting with rosuvastatin  5 mg every other day which patient was agreeable to  Plan: Start rosuvastatin  5 mg every other day Follow-up in August with PCP and consideration of increasing dose

## 2023-09-12 ENCOUNTER — Encounter: Payer: Self-pay | Admitting: Family Medicine

## 2023-09-12 ENCOUNTER — Ambulatory Visit: Admitting: Family Medicine

## 2023-09-12 VITALS — BP 131/70 | HR 70 | Temp 98.0°F | Ht 67.0 in | Wt 158.0 lb

## 2023-09-12 DIAGNOSIS — J4520 Mild intermittent asthma, uncomplicated: Secondary | ICD-10-CM

## 2023-09-12 DIAGNOSIS — I10 Essential (primary) hypertension: Secondary | ICD-10-CM

## 2023-09-12 DIAGNOSIS — E860 Dehydration: Secondary | ICD-10-CM

## 2023-09-12 DIAGNOSIS — Z0000 Encounter for general adult medical examination without abnormal findings: Secondary | ICD-10-CM | POA: Diagnosis not present

## 2023-09-12 DIAGNOSIS — E782 Mixed hyperlipidemia: Secondary | ICD-10-CM | POA: Diagnosis not present

## 2023-09-12 DIAGNOSIS — N1832 Chronic kidney disease, stage 3b: Secondary | ICD-10-CM | POA: Diagnosis not present

## 2023-09-12 NOTE — Progress Notes (Signed)
 Subjective:   [name] [name] is a 78 y.o. female who presents for Medicare Annual (Subsequent) preventive examination.  Visit Complete: In person  Patient Medicare AWV questionnaire was completed by the patient; I have confirmed that all information answered by patient is correct and no changes since this date.  Discussed the use of AI scribe software for clinical note transcription with the patient, who gave verbal consent to proceed.  History of Present Illness   [name] [name] is an 78 year old female with hypertension who presents with elevated blood pressure readings.  Hypertension - Elevated blood pressure readings, reaching up to 184/86 from Sunday to Tuesday - Home blood pressure typically ranges from 114/70 to 131/70 - Occasional higher blood pressure readings at night, associated with watching the news - Currently taking hydrochlorothiazide  12.5 mg once daily and carvedilol  6.25 mg twice daily (recently increased from 3.125 mg)  Hyperlipidemia - LDL cholesterol level of 188 as of June - Has not started cholesterol medication due to concern about potential side effects - Decreased physical activity  Renal function abnormalities - Concern about kidney function with low levels on four occasions (values: 23, 24, 25, 25) - Attributes low kidney function values to dehydration from inadequate water intake  Asthma - Uses Singulair  at night with good effect - Rarely uses albuterol  inhaler; required use every six hours for a short period a couple of weeks ago when unwell     Cardiac Risk Factors include: advanced age (>56men, >49 women);obesity (BMI >30kg/m2);hypertension  Review of Systems  Constitutional:  Negative for chills, diaphoresis, fever and malaise/fatigue.  HENT:  Negative for congestion, ear pain and sore throat.   Respiratory:  Negative for cough and sputum production.   Cardiovascular:  Negative for chest pain and palpitations.  Gastrointestinal:  Negative for  abdominal pain, constipation, diarrhea, nausea and vomiting.  Genitourinary:  Negative for dysuria and urgency.  Musculoskeletal:  Negative for myalgias.  Neurological:  Negative for dizziness and headaches.  Psychiatric/Behavioral:  Negative for depression. The patient is not nervous/anxious.        Objective:    Today's Vitals   09/12/23 1043 09/12/23 1045 09/15/23 2120  BP: (!) 182/98 (!) 184/94 131/70  Pulse: 70    Temp: 98 F (36.7 C)    SpO2: 98%    Weight: 158 lb (71.7 kg)    Height: 5' 7 (1.702 m)    PainSc: 0-No pain     Body mass index is 24.75 kg/m.  Physical Exam Vitals reviewed.  Constitutional:      Appearance: Normal appearance. She is normal weight.  Neck:     Vascular: No carotid bruit.  Cardiovascular:     Rate and Rhythm: Normal rate and regular rhythm.     Heart sounds: Normal heart sounds.  Pulmonary:     Effort: Pulmonary effort is normal. No respiratory distress.     Breath sounds: Normal breath sounds.  Abdominal:     General: Abdomen is flat. Bowel sounds are normal.     Palpations: Abdomen is soft.     Tenderness: There is no abdominal tenderness.  Neurological:     Mental Status: She is alert and oriented to person, place, and time.  Psychiatric:        Mood and Affect: Mood normal.        Behavior: Behavior normal.        09/12/2023   10:44 AM 01/23/2017   10:52 PM 01/24/2016   11:33 PM  Advanced Directives  Does Patient Have a Medical Advance Directive? No No  No   Would patient like information on creating a medical advance directive? No - Patient declined       Data saved with a previous flowsheet row definition    Current Medications (verified) Outpatient Encounter Medications as of 09/12/2023  Medication Sig   albuterol  (VENTOLIN  HFA) 108 (90 Base) MCG/ACT inhaler Inhale 2 puffs into the lungs every 6 (six) hours as needed for wheezing or shortness of breath.   carvedilol  (COREG ) 6.25 MG tablet Take 1 tablet (6.25 mg total)  by mouth 2 (two) times daily.   hydrochlorothiazide  (MICROZIDE ) 12.5 MG capsule Take 1 capsule (12.5 mg total) by mouth daily.   Multiple Vitamins-Minerals (MULTIVITAMIN WITH MINERALS) tablet Take 1 tablet by mouth daily.   [DISCONTINUED] montelukast  (SINGULAIR ) 10 MG tablet Take 1 tablet (10 mg total) by mouth at bedtime. (Patient not taking: Reported on 08/06/2023)   [DISCONTINUED] rosuvastatin  (CRESTOR ) 5 MG tablet Take 5 mg by mouth every other day.   No facility-administered encounter medications on file as of 09/12/2023.    Allergies (verified) Clindamycin, Levaquin [levofloxacin], Lisinopril, Tru-micin [misc natural products], and Erythromycin   History: Past Medical History:  Diagnosis Date   Allergic rhinitis due to pollen    CONSTIPATION    Essential hypertension 09/21/2008   Qualifier: Diagnosis of  By: Gwenn Grimes     GERD    MITRAL VALVE PROLAPSE    Nonrheumatic mitral (valve) prolapse    PREMATURE ATRIAL CONTRACTIONS    Solitary pulmonary nodule    Past Surgical History:  Procedure Laterality Date   BACK SURGERY     CATARACT EXTRACTION, BILATERAL Bilateral 2024   LAPAROSCOPIC HYSTERECTOMY     Family History  Problem Relation Age of Onset   Hypertension Mother    Heart disease Mother    Osteoporosis Mother    Thyroid disease Mother    Parkinson's disease Father    Thyroid disease Sister    Heart disease Other    Social History   Socioeconomic History   Marital status: Married    Spouse name: JW   Number of children: 2   Years of education: Not on file   Highest education level: Not on file  Occupational History   Occupation: Retired Programmer, systems  Tobacco Use   Smoking status: Never   Smokeless tobacco: Never  Substance and Sexual Activity   Alcohol use: No   Drug use: Never   Sexual activity: Not Currently  Other Topics Concern   Not on file  Social History Narrative   Not on file   Social Drivers of Health   Financial Resource Strain: Low  Risk  (07/20/2023)   Overall Financial Resource Strain (CARDIA)    Difficulty of Paying Living Expenses: Not hard at all  Food Insecurity: No Food Insecurity (07/20/2023)   Hunger Vital Sign    Worried About Running Out of Food in the Last Year: Never true    Ran Out of Food in the Last Year: Never true  Transportation Needs: No Transportation Needs (07/20/2023)   PRAPARE - Administrator, Civil Service (Medical): No    Lack of Transportation (Non-Medical): No  Physical Activity: Insufficiently Active (07/20/2023)   Exercise Vital Sign    Days of Exercise per Week: 3 days    Minutes of Exercise per Session: 30 min  Stress: No Stress Concern Present (07/20/2023)   [employer] of Occupational Health - Occupational Stress Questionnaire  Feeling of Stress: Not at all  Social Connections: Moderately Integrated (07/20/2023)   Social Connection and Isolation Panel    Frequency of Communication with Friends and Family: Three times a week    Frequency of Social Gatherings with Friends and Family: Three times a week    Attends Religious Services: More than 4 times per year    Active Member of Clubs or Organizations: No    Attends Banker Meetings: Never    Marital Status: Married    Tobacco Counseling Counseling given: Not Answered   Clinical Intake:  Pre-visit preparation completed: No  Pain : No/denies pain Pain Score: 0-No pain     Nutritional Status: BMI > 30  Obese Nutritional Risks: None Diabetes: No     Interpreter Needed?: No      Activities of Daily Living    09/12/2023   10:45 AM  In your present state of health, do you have any difficulty performing the following activities:  Hearing? 0  Vision? 0  Difficulty concentrating or making decisions? 0  Walking or climbing stairs? 0  Dressing or bathing? 0  Doing errands, shopping? 0  Preparing Food and eating ? N  Using the Toilet? N  In the past six months, have you accidently  leaked urine? N  Do you have problems with loss of bowel control? N  Managing your Medications? N  Managing your Finances? N  Housekeeping or managing your Housekeeping? N    Patient Care Team: Sherre Clapper, MD as PCP - General (Family Medicine) [facility] (Gynecology) Mat Browning, MD as Consulting Physician (Obstetrics and Gynecology) Wonda Sharper, MD as Consulting Physician (Cardiology) Tobie Robbi LABOR, MD Lloyd Savannah, MD as Referring Physician (Dermatology)  Indicate any recent Medical Services you may have received from other than [facility] providers in the past year (date may be approximate).     Assessment:   This is a routine wellness examination for [name] [name].  Hearing/Vision screen No results found.    Depression Screen    09/12/2023   10:44 AM 07/20/2023    9:47 AM 08/08/2022   10:12 AM 06/06/2022    8:00 AM 11/30/2021    9:00 AM 08/12/2019    2:00 PM  PHQ 2/9 Scores  PHQ - 2 Score 0 0 0 0 0 0    Fall Risk    09/12/2023   10:44 AM 07/20/2023    9:51 AM 08/08/2022    9:14 AM 06/06/2022    8:00 AM  Fall Risk   Falls in the past year? 0 0 0 0  Number falls in past yr: 1 0 0 0  Injury with Fall? 0 0 0 0  Risk for fall due to : No Fall Risks No Fall Risks No Fall Risks No Fall Risks  Follow up Falls evaluation completed Falls evaluation completed Falls evaluation completed;Education provided Falls evaluation completed    MEDICARE RISK AT HOME: Medicare Risk at Home Any stairs in or around the home?: Yes If so, are there any without handrails?: Yes Home free of loose throw rugs in walkways, pet beds, electrical cords, etc?: Yes Adequate lighting in your home to reduce risk of falls?: Yes Life alert?: No Use of a cane, walker or w/c?: No Grab bars in the bathroom?: No Shower chair or bench in shower?: No Elevated toilet seat or a handicapped toilet?: No  TIMED UP AND GO:  Was the test performed?  Yes  Length of time to ambulate  10 feet: 2 sec Gait steady and fast without use of assistive device    Cognitive Function:        09/12/2023   10:46 AM 08/08/2022   10:13 AM  6CIT Screen  What Year?  0 points  What month?  0 points  What time? 0 points 0 points  Count back from 20 0 points 0 points  Months in reverse 0 points 0 points  Repeat phrase 0 points 0 points  Total Score  0 points    Immunizations Immunization History  Administered Date(s) Administered   Fluad Quad(high Dose 65+) 11/20/2019, 11/30/2021   Hepatitis B 03/01/2000, 05/07/2000, 09/04/2000   Influenza,inj,Quad PF,6+ Mos 12/13/2022   Influenza-Unspecified 11/08/2017, 11/15/2018, 11/29/2020   PFIZER(Purple Top)SARS-COV-2 Vaccination 02/09/2019, 03/01/2019, 10/30/2019   Pneumococcal Conjugate-13 01/19/2015   Pneumococcal Polysaccharide-23 06/08/2011   Respiratory Syncytial Virus Vaccine,Recomb Aduvanted(Arexvy) 02/06/2023   Zoster Recombinant(Shingrix) 08/08/2022, 10/18/2022    Screening Tests Health Maintenance  Topic Date Due   DTaP/Tdap/Td (1 - Tdap) Never done   COVID-19 Vaccine (4 - 2024-25 season) 09/24/2022   INFLUENZA VACCINE  11/22/2023 (Originally 08/24/2023)   MAMMOGRAM  07/02/2024   Medicare Annual Wellness (AWV)  09/14/2024   Pneumococcal Vaccine: 50+ Years  Completed   DEXA SCAN  Completed   Zoster Vaccines- Shingrix  Completed   HPV VACCINES  Aged Out   Meningococcal B Vaccine  Aged Out   Hepatitis B Vaccines 19-59 Average Risk  Discontinued   Colonoscopy  Discontinued   Hepatitis C Screening  Discontinued    Health Maintenance  Health Maintenance Due  Topic Date Due   DTaP/Tdap/Td (1 - Tdap) Never done   COVID-19 Vaccine (4 - 2024-25 season) 09/24/2022   Additional Screening:  Vision Screening: Recommended annual ophthalmology exams for early detection of glaucoma and other disorders of the eye. Is the patient up to date with their annual eye exam?  Yes  Who is the provider or what is the name of the office  in which the patient attends annual eye exams? [facility]  Dental Screening: Recommended annual dental exams for proper oral hygiene   Community Resource Referral / Chronic Care Management: CRR required this visit?  No   CCM required this visit?  No     Plan:    Encounter for Medicare annual wellness exam Assessment & Plan: Education given.    Essential hypertension Assessment & Plan: Blood pressure well-controlled at home, clinic readings suggest white coat hypertension. - Continue hydrochlorothiazide  12.5 mg once daily and carvedilol  6.25 mg twice daily.   Mixed hyperlipidemia Assessment & Plan: LDL cholesterol elevated at 188 mg/dL. Discussed importance of managing cholesterol to prevent cardiovascular disease. - Start Crestor  (rosuvastatin ) once daily. - Consider every other day dosing if concerned about side effects, aim for daily dosing. - Schedule follow-up in early October to recheck cholesterol levels.   Stage 3b chronic kidney disease (HCC) Assessment & Plan: Recommend hydration.   Mild intermittent asthma, uncomplicated Assessment & Plan: Symptoms generally well-controlled, occasional increased albuterol  use. Singulair  providing relief. Discussed new inhaler with albuterol  and steroid, concerns about steroid use. - Continue Singulair  (montelukast ) as needed. - Consider prescribing new inhaler with albuterol  and steroid if needed.   Dehydration Assessment & Plan: Low fluid intake contributing to dehydration. - Increase daily water intake to 64 ounces. - Consider adding flavor to water.      I have personally reviewed and noted the following in the patient's chart:   Medical and social history Use  of alcohol, tobacco or illicit drugs  Current medications and supplements including opioid prescriptions. Patient is not currently taking opioid prescriptions. Functional ability and status Nutritional status Physical activity Advanced directives List  of other physicians Hospitalizations, surgeries, and ER visits in previous 12 months Vitals Screenings to include cognitive, depression, and falls Referrals and appointments  In addition, I have reviewed and discussed with patient certain preventive protocols, quality metrics, and best practice recommendations. A written personalized care plan for preventive services as well as general preventive health recommendations were provided to patient.    I attest that I have reviewed this visit and agree with the plan scribed by my staff.   Abigail Free, MD [facility] [phone]

## 2023-09-15 DIAGNOSIS — Z0000 Encounter for general adult medical examination without abnormal findings: Secondary | ICD-10-CM | POA: Insufficient documentation

## 2023-09-15 DIAGNOSIS — E860 Dehydration: Secondary | ICD-10-CM | POA: Insufficient documentation

## 2023-09-15 DIAGNOSIS — N1832 Chronic kidney disease, stage 3b: Secondary | ICD-10-CM | POA: Insufficient documentation

## 2023-09-15 NOTE — Assessment & Plan Note (Signed)
 Recommend hydration

## 2023-09-15 NOTE — Assessment & Plan Note (Signed)
 LDL cholesterol elevated at 188 mg/dL. Discussed importance of managing cholesterol to prevent cardiovascular disease. - Start Crestor  (rosuvastatin ) once daily. - Consider every other day dosing if concerned about side effects, aim for daily dosing. - Schedule follow-up in early October to recheck cholesterol levels.

## 2023-09-15 NOTE — Assessment & Plan Note (Signed)
 Education given.

## 2023-09-15 NOTE — Assessment & Plan Note (Signed)
 Symptoms generally well-controlled, occasional increased albuterol  use. Singulair  providing relief. Discussed new inhaler with albuterol  and steroid, concerns about steroid use. - Continue Singulair  (montelukast ) as needed. - Consider prescribing new inhaler with albuterol  and steroid if needed.

## 2023-09-15 NOTE — Patient Instructions (Signed)

## 2023-09-15 NOTE — Assessment & Plan Note (Signed)
 Low fluid intake contributing to dehydration. - Increase daily water intake to 64 ounces. - Consider adding flavor to water.

## 2023-09-15 NOTE — Assessment & Plan Note (Signed)
 Blood pressure well-controlled at home, clinic readings suggest white coat hypertension. - Continue hydrochlorothiazide  12.5 mg once daily and carvedilol  6.25 mg twice daily.

## 2023-09-28 ENCOUNTER — Encounter: Payer: Self-pay | Admitting: Pharmacist

## 2023-11-08 ENCOUNTER — Ambulatory Visit: Admitting: Family Medicine

## 2023-11-13 NOTE — Assessment & Plan Note (Addendum)
 LDL cholesterol elevated at 220 mg/dL, previously 819 mg/dL. No prior cholesterol-lowering medications. - Start Crestor  5 mg daily. Orders:   POCT Lipid Panel   Comprehensive metabolic panel with GFR; Future
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
## 2023-11-14 ENCOUNTER — Ambulatory Visit: Payer: Self-pay | Admitting: Family Medicine

## 2023-11-14 ENCOUNTER — Ambulatory Visit: Admitting: Family Medicine

## 2023-11-14 ENCOUNTER — Encounter: Payer: Self-pay | Admitting: Family Medicine

## 2023-11-14 VITALS — BP 154/72 | HR 76 | Temp 98.2°F | Ht 67.0 in | Wt 164.0 lb

## 2023-11-14 DIAGNOSIS — Z23 Encounter for immunization: Secondary | ICD-10-CM

## 2023-11-14 DIAGNOSIS — E782 Mixed hyperlipidemia: Secondary | ICD-10-CM | POA: Diagnosis not present

## 2023-11-14 DIAGNOSIS — N1832 Chronic kidney disease, stage 3b: Secondary | ICD-10-CM

## 2023-11-14 DIAGNOSIS — K5909 Other constipation: Secondary | ICD-10-CM | POA: Diagnosis not present

## 2023-11-14 DIAGNOSIS — J301 Allergic rhinitis due to pollen: Secondary | ICD-10-CM | POA: Diagnosis not present

## 2023-11-14 DIAGNOSIS — I10 Essential (primary) hypertension: Secondary | ICD-10-CM

## 2023-11-14 LAB — POCT LIPID PANEL
HDL: 83
LDL: 220
Non-HDL: 251
TC: 334
TRG: 157

## 2023-11-14 MED ORDER — MONTELUKAST SODIUM 10 MG PO TABS: 10.0000 mg | ORAL_TABLET | Freq: Every day | ORAL | 1 refills | Status: AC

## 2023-11-14 NOTE — Assessment & Plan Note (Addendum)
 Concern about hydrochlorothiazide 's impact on kidney function. Previous tests consistent with last year. - Check kidney function in two to three weeks after stopping hydrochlorothiazide . Orders:   Comprehensive metabolic panel with GFR; Future

## 2023-11-14 NOTE — Assessment & Plan Note (Addendum)
 Morning cough likely due to post-nasal drip. Xyzal causes drowsiness. Singulair  effective without drowsiness. - Take Singulair  at night for allergy management. Orders:   montelukast  (SINGULAIR ) 10 MG tablet; Take 1 tablet (10 mg total) by mouth at bedtime.

## 2023-11-14 NOTE — Assessment & Plan Note (Addendum)
 Blood pressure elevated in office, normal at home. Hypotensive episodes post-medication. Current regimen includes carvedilol  and hydrochlorothiazide . Losartan and amlodipine  intolerable. Lisinopril caused hives. Considering medication change due to side effects and fluctuations. Discussed hydrochlorothiazide 's impact on kidney function and dehydration risk. - Pause hydrochlorothiazide , monitor blood pressure at home for two weeks. - Reassess blood pressure in two weeks, consider alternative medication if blood pressure increases. - Check kidney function in two to three weeks after stopping hydrochlorothiazide .

## 2023-11-14 NOTE — Patient Instructions (Addendum)
  VISIT SUMMARY: Today, we discussed your blood pressure management, diabetes control, cholesterol levels, allergies, and general health maintenance. We made some changes to your medications and provided recommendations to help manage your conditions more effectively.  YOUR PLAN: ESSENTIAL HYPERTENSION: Your blood pressure has been stable at home but occasionally spikes. You experienced a drop in blood pressure after taking your medication, which made you feel unwell. -Pause hydrochlorothiazide  and monitor your blood pressure at home for two weeks. -We will reassess your blood pressure in two weeks and consider alternative medication if needed. -Check your kidney function in two to three weeks after stopping hydrochlorothiazide .  CHRONIC KIDNEY DISEASE, UNSPECIFIED STAGE:  -Check your kidney function in two to three weeks after stopping hydrochlorothiazide .  MIXED HYPERLIPIDEMIA: Your LDL cholesterol is elevated. -Start taking Crestor  5 mg daily.  ALLERGIC RHINITIS: You have a morning cough likely due to post-nasal drip, and Xyzal causes drowsiness. -Take Singulair  at night for allergy management. - You may take claritin with the singulair  if needed.   CONSTIPATION: Your constipation has improved with increased water intake. -Continue increasing your water intake.  GENERAL HEALTH MAINTENANCE: You are due for a flu vaccination. -Administer flu shot.                     Contains text generated by [employer].                                 Contains text generated by [employer]bridge.

## 2023-11-17 DIAGNOSIS — K5909 Other constipation: Secondary | ICD-10-CM | POA: Insufficient documentation

## 2023-11-17 NOTE — Assessment & Plan Note (Signed)
 Improvement with increased water intake. - Continue increased water intake.

## 2023-11-20 DIAGNOSIS — R229 Localized swelling, mass and lump, unspecified: Secondary | ICD-10-CM | POA: Diagnosis not present

## 2023-11-20 DIAGNOSIS — L2989 Other pruritus: Secondary | ICD-10-CM | POA: Diagnosis not present

## 2023-11-20 DIAGNOSIS — L821 Other seborrheic keratosis: Secondary | ICD-10-CM | POA: Diagnosis not present

## 2023-11-20 DIAGNOSIS — L814 Other melanin hyperpigmentation: Secondary | ICD-10-CM | POA: Diagnosis not present

## 2023-11-20 DIAGNOSIS — D1801 Hemangioma of skin and subcutaneous tissue: Secondary | ICD-10-CM | POA: Diagnosis not present

## 2023-11-20 DIAGNOSIS — Z85828 Personal history of other malignant neoplasm of skin: Secondary | ICD-10-CM | POA: Diagnosis not present

## 2023-11-20 DIAGNOSIS — L728 Other follicular cysts of the skin and subcutaneous tissue: Secondary | ICD-10-CM | POA: Diagnosis not present

## 2023-11-20 DIAGNOSIS — Z08 Encounter for follow-up examination after completed treatment for malignant neoplasm: Secondary | ICD-10-CM | POA: Diagnosis not present

## 2023-11-20 DIAGNOSIS — L820 Inflamed seborrheic keratosis: Secondary | ICD-10-CM | POA: Diagnosis not present

## 2023-12-06 ENCOUNTER — Other Ambulatory Visit

## 2023-12-06 ENCOUNTER — Other Ambulatory Visit: Payer: Self-pay | Admitting: Family Medicine

## 2023-12-06 ENCOUNTER — Telehealth: Payer: Self-pay

## 2023-12-06 DIAGNOSIS — N1832 Chronic kidney disease, stage 3b: Secondary | ICD-10-CM

## 2023-12-06 DIAGNOSIS — E782 Mixed hyperlipidemia: Secondary | ICD-10-CM | POA: Diagnosis not present

## 2023-12-06 NOTE — Telephone Encounter (Signed)
 Per Dr. Sherre, while patient was here for lab work she showed her home bp readings 120/145--70/80. Patient did not d/c hydrochlorothiazide  as advised previously due to fearing her bp would be high. Per Dr. Sherre, patient to continue hydrochlorothiazide  and keeping a bp log. Patient aware and verbalized understanding.

## 2023-12-07 LAB — COMPREHENSIVE METABOLIC PANEL WITH GFR
ALT: 11 IU/L (ref 0–32)
AST: 22 IU/L (ref 0–40)
Albumin: 4.2 g/dL (ref 3.8–4.8)
Alkaline Phosphatase: 65 IU/L (ref 49–135)
BUN/Creatinine Ratio: 24 (ref 12–28)
BUN: 33 mg/dL — ABNORMAL HIGH (ref 8–27)
Bilirubin Total: 0.5 mg/dL (ref 0.0–1.2)
CO2: 25 mmol/L (ref 20–29)
Calcium: 9.7 mg/dL (ref 8.7–10.3)
Chloride: 105 mmol/L (ref 96–106)
Creatinine, Ser: 1.39 mg/dL — ABNORMAL HIGH (ref 0.57–1.00)
Globulin, Total: 2.3 g/dL (ref 1.5–4.5)
Glucose: 92 mg/dL (ref 70–99)
Potassium: 4.6 mmol/L (ref 3.5–5.2)
Sodium: 141 mmol/L (ref 134–144)
Total Protein: 6.5 g/dL (ref 6.0–8.5)
eGFR: 38 mL/min/1.73 — ABNORMAL LOW (ref >59)

## 2023-12-14 ENCOUNTER — Other Ambulatory Visit: Payer: Self-pay

## 2023-12-14 MED ORDER — CARVEDILOL 12.5 MG PO TABS: 12.5000 mg | ORAL_TABLET | Freq: Two times a day (BID) | ORAL | 3 refills | Status: AC

## 2023-12-14 NOTE — Progress Notes (Signed)
 Patient came in stating that she is still having high blood pressure readings and the only thing she is taking is 6.25 mg Carvedilol  1 tablet twice daily. So there for per Dr. Sherre patient is to increase her Carvedilol  to 12.5 mg 1 tablet twice daily. Sending to pharmacy and follow up in 2 weeks for a office visit.

## 2023-12-24 DIAGNOSIS — D3131 Benign neoplasm of right choroid: Secondary | ICD-10-CM | POA: Diagnosis not present

## 2023-12-24 DIAGNOSIS — H353131 Nonexudative age-related macular degeneration, bilateral, early dry stage: Secondary | ICD-10-CM | POA: Diagnosis not present

## 2023-12-26 ENCOUNTER — Ambulatory Visit: Admitting: Family Medicine

## 2023-12-26 ENCOUNTER — Encounter: Payer: Self-pay | Admitting: Family Medicine

## 2023-12-26 VITALS — BP 128/80 | HR 60 | Temp 98.4°F | Ht 67.0 in | Wt 162.2 lb

## 2023-12-26 DIAGNOSIS — R3589 Other polyuria: Secondary | ICD-10-CM | POA: Diagnosis not present

## 2023-12-26 DIAGNOSIS — I10 Essential (primary) hypertension: Secondary | ICD-10-CM

## 2023-12-26 DIAGNOSIS — N1832 Chronic kidney disease, stage 3b: Secondary | ICD-10-CM | POA: Diagnosis not present

## 2023-12-26 LAB — POCT URINALYSIS DIP (CLINITEK)
Bilirubin, UA: NEGATIVE
Glucose, UA: NEGATIVE mg/dL
Ketones, POC UA: NEGATIVE mg/dL
Leukocytes, UA: NEGATIVE
Nitrite, UA: NEGATIVE
POC PROTEIN,UA: NEGATIVE
Spec Grav, UA: 1.01 (ref 1.010–1.025)
Urobilinogen, UA: 0.2 U/dL
pH, UA: 6 (ref 5.0–8.0)

## 2023-12-26 NOTE — Assessment & Plan Note (Addendum)
 Well-managed with stable GFR despite recent hydrochlorothiazide  discontinuation. No dialysis needed as GFR is above 10. - Referred to nephrology for further evaluation and management. - Advised to call nephrology next week to check on referral status. - Rechecked kidney function. Orders:   Ambulatory referral to Nephrology

## 2023-12-26 NOTE — Progress Notes (Signed)
 Subjective:  Patient ID: [name] [name], female    DOB: [birth year]  Age: 78 y.o. MRN: [id number]  Chief Complaint  Patient presents with   Hypertension    HPI: Discussed the use of AI scribe software for clinical note transcription with the patient, who gave verbal consent to proceed.  History of Present Illness [name] [name] is an 78 year old female with hypertension and stage 3B chronic kidney disease who presents with elevated blood pressure readings. She is accompanied by her husband, Norleen.  Hypertension - Elevated blood pressure readings, with values up to 170/85 mmHg - Blood pressure fluctuates, with readings as low as 123/69 mmHg and as high as 160/85 mmHg - Currently taking carvedilol  12.5 mg twice daily; has been taking doses earlier than usual when blood pressure is high - Hydrochlorothiazide  discontinued two weeks ago due to concerns regarding kidney function  Chronic kidney disease - Stage 3B chronic kidney disease, stable for two years - Hydrochlorothiazide  discontinued due to potential adverse effects on renal function - Awaiting referral to nephrology for further evaluation  Constitutional symptoms - Sluggishness attributed to current medication regimen - Consistently low body temperature, associated with concern for thyroid dysfunction - Thyroid levels check within the last 6 months and was normal  Thyroid function - Family history of thyroid disease; mother and sister on thyroid medication - Thyroid function testing in June was normal  Respiratory symptoms - Occasional shortness of breath, associated with asthma  Genitourinary symptoms - Increased urinary frequency, possibly related to increased water intake - Sensation of incomplete bladder emptying - No abdominal pain, bowel problems, or other bladder issues       12/26/2023   10:44 AM 09/12/2023   10:44 AM 07/20/2023    9:47 AM 08/08/2022   10:12 AM 06/06/2022    8:00 AM  Depression screen PHQ 2/9   Decreased Interest 0 0 0 0 0  Down, Depressed, Hopeless 0 0 0 0 0  PHQ - 2 Score 0 0 0 0 0        12/26/2023   10:44 AM  Fall Risk   Falls in the past year? 0  Number falls in past yr: 0  Injury with Fall? 0  Risk for fall due to : No Fall Risks  Follow up Falls evaluation completed    Patient Care Team: Sherre Clapper, MD as PCP - General (Family Medicine) [facility] (Gynecology) Mat Browning, MD as Consulting Physician (Obstetrics and Gynecology) Wonda Sharper, MD as Consulting Physician (Cardiology) Tobie Robbi LABOR, MD Lloyd Savannah, MD as Referring Physician (Dermatology)   Review of Systems  Constitutional:  Negative for chills, fatigue and fever.  HENT:  Negative for congestion, ear pain and sore throat.   Respiratory:  Negative for cough and shortness of breath.   Cardiovascular:  Negative for chest pain.  Gastrointestinal:  Negative for abdominal pain, constipation, diarrhea, nausea and vomiting.  Endocrine: Positive for polyuria.  Genitourinary:  Positive for frequency. Negative for dysuria and urgency.  Musculoskeletal:  Negative for arthralgias and myalgias.  Skin:  Negative for rash.  Neurological:  Negative for dizziness and headaches.  Psychiatric/Behavioral:  Negative for dysphoric mood. The patient is not nervous/anxious.     Current Outpatient Medications on File Prior to Visit  Medication Sig Dispense Refill   albuterol  (VENTOLIN  HFA) 108 (90 Base) MCG/ACT inhaler Inhale 2 puffs into the lungs every 6 (six) hours as needed for wheezing or shortness of breath. 8 g 0  carvedilol  (COREG ) 12.5 MG tablet Take 1 tablet (12.5 mg total) by mouth 2 (two) times daily with a meal. 60 tablet 3   montelukast  (SINGULAIR ) 10 MG tablet Take 1 tablet (10 mg total) by mouth at bedtime. 90 tablet 1   Multiple Vitamins-Minerals (MULTIVITAMIN WITH MINERALS) tablet Take 1 tablet by mouth daily.     No current facility-administered medications  on file prior to visit.   Past Medical History:  Diagnosis Date   Allergic rhinitis due to pollen    CONSTIPATION    Essential hypertension 09/21/2008   Qualifier: Diagnosis of  By: Gwenn Grimes     GERD    MITRAL VALVE PROLAPSE    Nonrheumatic mitral (valve) prolapse    PREMATURE ATRIAL CONTRACTIONS    Solitary pulmonary nodule    Past Surgical History:  Procedure Laterality Date   BACK SURGERY     CATARACT EXTRACTION, BILATERAL Bilateral 2024   LAPAROSCOPIC HYSTERECTOMY      Family History  Problem Relation Age of Onset   Hypertension Mother    Heart disease Mother    Osteoporosis Mother    Thyroid disease Mother    Parkinson's disease Father    Thyroid disease Sister    Heart disease Other    Social History   Socioeconomic History   Marital status: Married    Spouse name: JW   Number of children: 2   Years of education: Not on file   Highest education level: Not on file  Occupational History   Occupation: Retired programmer, systems  Tobacco Use   Smoking status: Never   Smokeless tobacco: Never  Substance and Sexual Activity   Alcohol use: No   Drug use: Never   Sexual activity: Not Currently  Other Topics Concern   Not on file  Social History Narrative   Not on file   Social Drivers of Health   Financial Resource Strain: Low Risk  (07/20/2023)   Overall Financial Resource Strain (CARDIA)    Difficulty of Paying Living Expenses: Not hard at all  Food Insecurity: No Food Insecurity (07/20/2023)   Hunger Vital Sign    Worried About Running Out of Food in the Last Year: Never true    Ran Out of Food in the Last Year: Never true  Transportation Needs: No Transportation Needs (07/20/2023)   PRAPARE - Administrator, Civil Service (Medical): No    Lack of Transportation (Non-Medical): No  Physical Activity: Insufficiently Active (07/20/2023)   Exercise Vital Sign    Days of Exercise per Week: 3 days    Minutes of Exercise per Session: 30 min  Stress:  No Stress Concern Present (07/20/2023)   [employer] of Occupational Health - Occupational Stress Questionnaire    Feeling of Stress: Not at all  Social Connections: Moderately Integrated (07/20/2023)   Social Connection and Isolation Panel    Frequency of Communication with Friends and Family: Three times a week    Frequency of Social Gatherings with Friends and Family: Three times a week    Attends Religious Services: More than 4 times per year    Active Member of Clubs or Organizations: No    Attends Banker Meetings: Never    Marital Status: Married    Objective:  BP 128/80   Pulse 60   Temp 98.4 F (36.9 C)   Ht 5' 7 (1.702 m)   Wt 162 lb 3.2 oz (73.6 kg)   SpO2 97%   BMI 25.40 kg/m  12/26/2023   11:38 AM 12/26/2023   10:42 AM 11/14/2023    2:55 PM  BP/Weight  Systolic BP 128 148 154  Diastolic BP 80 76 72  Wt. (Lbs)  162.2 164  BMI  25.4 kg/m2 25.69 kg/m2    Physical Exam Vitals reviewed.  Constitutional:      Appearance: Normal appearance. She is normal weight.  Neck:     Vascular: No carotid bruit.  Cardiovascular:     Rate and Rhythm: Normal rate and regular rhythm.     Heart sounds: Normal heart sounds.  Pulmonary:     Effort: Pulmonary effort is normal. No respiratory distress.     Breath sounds: Normal breath sounds.  Abdominal:     General: Abdomen is flat. Bowel sounds are normal.     Palpations: Abdomen is soft.     Tenderness: There is no abdominal tenderness.  Neurological:     Mental Status: She is alert and oriented to person, place, and time.  Psychiatric:        Mood and Affect: Mood normal.        Behavior: Behavior normal.         Lab Results  Component Value Date   WBC 8.5 07/20/2023   HGB 12.5 07/20/2023   HCT 38.2 07/20/2023   PLT 225 07/20/2023   GLUCOSE 91 12/26/2023   CHOL 270 (H) 07/20/2023   TRIG 116 07/20/2023   HDL 62 07/20/2023   LDLCALC 188 (H) 07/20/2023   ALT 12 12/26/2023   AST 24  12/26/2023   NA 142 12/26/2023   K 4.6 12/26/2023   CL 104 12/26/2023   CREATININE 1.13 (H) 12/26/2023   BUN 23 12/26/2023   CO2 22 12/26/2023   TSH 1.340 07/20/2023   HGBA1C 5.5 02/02/2016    Results for orders placed or performed in visit on 12/26/23  Comprehensive metabolic panel with GFR   Collection Time: 12/26/23 11:58 AM  Result Value Ref Range   Glucose 91 70 - 99 mg/dL   BUN 23 8 - 27 mg/dL   Creatinine, Ser 8.86 (H) 0.57 - 1.00 mg/dL   eGFR 49 (L) >40 fO/fpw/8.26   BUN/Creatinine Ratio 20 12 - 28   Sodium 142 134 - 144 mmol/L   Potassium 4.6 3.5 - 5.2 mmol/L   Chloride 104 96 - 106 mmol/L   CO2 22 20 - 29 mmol/L   Calcium  9.6 8.7 - 10.3 mg/dL   Total Protein 6.5 6.0 - 8.5 g/dL   Albumin 4.2 3.8 - 4.8 g/dL   Globulin, Total 2.3 1.5 - 4.5 g/dL   Bilirubin Total 0.3 0.0 - 1.2 mg/dL   Alkaline Phosphatase 61 49 - 135 IU/L   AST 24 0 - 40 IU/L   ALT 12 0 - 32 IU/L  POCT URINALYSIS DIP (CLINITEK)   Collection Time: 12/26/23  4:10 PM  Result Value Ref Range   Color, UA yellow yellow   Clarity, UA clear clear   Glucose, UA negative negative mg/dL   Bilirubin, UA negative negative   Ketones, POC UA negative negative mg/dL   Spec Grav, UA 8.989 8.989 - 1.025   Blood, UA trace-intact (A) negative   pH, UA 6.0 5.0 - 8.0   POC PROTEIN,UA negative negative, trace   Urobilinogen, UA 0.2 0.2 or 1.0 E.U./dL   Nitrite, UA Negative Negative   Leukocytes, UA Negative Negative  .  Assessment & Plan:   Assessment & Plan Benign hypertension Blood pressure variable but generally  well-controlled on carvedilol . Sluggishness possibly due to medication. - Continue carvedilol  12.5 mg twice daily. - Monitor blood pressure and symptoms of sluggishness. - Consider alternative antihypertensive if symptoms persist. Orders:   Comprehensive metabolic panel with GFR  Stage 3b chronic kidney disease (HCC) Well-managed with stable GFR despite recent hydrochlorothiazide   discontinuation. No dialysis needed as GFR is above 10. - Referred to nephrology for further evaluation and management. - Advised to call nephrology next week to check on referral status. - Rechecked kidney function. Orders:   Ambulatory referral to Nephrology  Polyuria Increased urinary frequency likely due to increased water intake. - Checked urine for signs of infection. Orders:   POCT URINALYSIS DIP (CLINITEK)   Body mass index is 25.4 kg/m.   No orders of the defined types were placed in this encounter.   Orders Placed This Encounter  Procedures   Comprehensive metabolic panel with GFR   Ambulatory referral to Nephrology   POCT URINALYSIS DIP (CLINITEK)     I,Marla I Leal-Borjas,acting as a scribe for Abigail Free, MD.,have documented all relevant documentation on the behalf of Abigail Free, MD,as directed by  Abigail Free, MD while in the presence of Abigail Free, MD.   Follow-up: No follow-ups on file.  An After Visit Summary was printed and given to the patient.  I attest that I have reviewed this visit and agree with the plan scribed by my staff.   Abigail Free, MD [facility] [phone]

## 2023-12-27 LAB — COMPREHENSIVE METABOLIC PANEL WITH GFR
ALT: 12 IU/L (ref 0–32)
AST: 24 IU/L (ref 0–40)
Albumin: 4.2 g/dL (ref 3.8–4.8)
Alkaline Phosphatase: 61 IU/L (ref 49–135)
BUN/Creatinine Ratio: 20 (ref 12–28)
BUN: 23 mg/dL (ref 8–27)
Bilirubin Total: 0.3 mg/dL (ref 0.0–1.2)
CO2: 22 mmol/L (ref 20–29)
Calcium: 9.6 mg/dL (ref 8.7–10.3)
Chloride: 104 mmol/L (ref 96–106)
Creatinine, Ser: 1.13 mg/dL — ABNORMAL HIGH (ref 0.57–1.00)
Globulin, Total: 2.3 g/dL (ref 1.5–4.5)
Glucose: 91 mg/dL (ref 70–99)
Potassium: 4.6 mmol/L (ref 3.5–5.2)
Sodium: 142 mmol/L (ref 134–144)
Total Protein: 6.5 g/dL (ref 6.0–8.5)
eGFR: 49 mL/min/1.73 — ABNORMAL LOW (ref >59)

## 2023-12-28 ENCOUNTER — Ambulatory Visit: Payer: Self-pay | Admitting: Family Medicine

## 2023-12-30 DIAGNOSIS — R3589 Other polyuria: Secondary | ICD-10-CM | POA: Insufficient documentation

## 2023-12-30 NOTE — Assessment & Plan Note (Addendum)
 Blood pressure variable but generally well-controlled on carvedilol . Sluggishness possibly due to medication. - Continue carvedilol  12.5 mg twice daily. - Monitor blood pressure and symptoms of sluggishness. - Consider alternative antihypertensive if symptoms persist. Orders:   Comprehensive metabolic panel with GFR

## 2023-12-30 NOTE — Assessment & Plan Note (Addendum)
 Increased urinary frequency likely due to increased water intake. - Checked urine for signs of infection. Orders:   POCT URINALYSIS DIP (CLINITEK)

## 2024-01-11 ENCOUNTER — Encounter: Payer: Self-pay | Admitting: Cardiovascular Disease

## 2024-01-22 ENCOUNTER — Telehealth: Payer: Self-pay | Admitting: Family Medicine

## 2024-01-22 ENCOUNTER — Ambulatory Visit

## 2024-01-22 ENCOUNTER — Other Ambulatory Visit: Payer: Self-pay | Admitting: Family Medicine

## 2024-01-22 DIAGNOSIS — I10 Essential (primary) hypertension: Secondary | ICD-10-CM

## 2024-01-22 MED ORDER — OLMESARTAN MEDOXOMIL 20 MG PO TABS: 20.0000 mg | ORAL_TABLET | Freq: Every day | ORAL | 1 refills | Status: DC

## 2024-01-22 MED ORDER — OLMESARTAN MEDOXOMIL 20 MG PO TABS: 20.0000 mg | ORAL_TABLET | Freq: Every day | ORAL | Status: DC

## 2024-01-22 NOTE — Progress Notes (Signed)
 Patient came in for  BP check. Patient stated that her Carvedilol  was recently increased to 12.5, she states that it doesn't seem to be holding BP down long enough, also when it does come down it comes down pretty quickly.   Patient stats she took BP this AM at 6:30 and it was 168/93, she took BP med at that time.   BP in office this AM was 164/76  HR 58 and five minutes later it was 172/80.     Patient states she is having fatigue, dizziness, a little nausea.   Per Dr Sherre decreased carvedilol  to 6.25. She can take half of the 12.5, two times daily. Add olmesartan 20mg  once daily.

## 2024-01-22 NOTE — Telephone Encounter (Signed)
 Patient walked in with BP issue. Patient stated that her Carvedilol  was recently increased to 12.5, she states that it doesn't seem to be holding BP down long enough, also when it does come down it comes down pretty quickly.  Patient stats she took BP this AM at 6:30 and it was 168/93, she took BP med at that time.  BP in office this AM was 164/76  HR 58 and five minutes later it was 172/80.    Patient states she is having fatigue, dizziness, a little nausea.  Per Dr Sherre decreased carvedilol  to 6.25. She can take half of the 12.5, two times daily. Add olmesartan 20mg  once daily.

## 2024-01-25 ENCOUNTER — Telehealth: Payer: Self-pay | Admitting: Cardiovascular Disease

## 2024-01-25 NOTE — Telephone Encounter (Signed)
 Pt c/o medication issue:  1. Name of Medication:  carvedilol  (COREG ) 12.5 MG tablet   olmesartan (BENICAR) 20 MG tablet   2. How are you currently taking this medication (dosage and times per day)?   3. Are you having a reaction (difficulty breathing--STAT)?   4. What is your medication issue?  Pcp decreased Carvedilol  to 6.25 MG and she would like a call back to discuss. She mentions she is also taking Olmesartan. When after taking medication(s) BP drops very low and she doesn't feel well. She thinks medications may need to to changed.

## 2024-01-25 NOTE — Telephone Encounter (Signed)
 I called and spoke with patient.  12/31 AM  BP was 159/95 HR 62 (had skipped carvedilol  at night) took olmesartan at 5:30PM  Took 6.25mg  of carvedilol  at 8:45 AM  100/59 after meds at 9:45 1/1 carvedilol  and olmesartan in the AM 187/101, 179/97, 150/80 On New years she ate Little bit of ham, potato salad, wild rice,  HR back into the 60's after decreasing carvedilol  I advised that it is reasonable that her BP has increased due to being off hydrochlorothiazide  and possible dietary indiscretion with holiday. I advised patient that she has only taken 3 doses of olmesartan and this really isn't enough time for it to work fully.  Recommended she give it a little more time. She is seeing Dr. Sherre at the end of the month and Tessa in Feb.

## 2024-01-25 NOTE — Telephone Encounter (Signed)
 Spoke with Pt. Pt states she ate some ham before Christmas and BP level went back up. Went to PCP on 31st, who decreased Carvedilol  6.25 BID and started 20mg  of olmesartan daily. Does not have BP readings but says they are high, HR in 50's and feeling bad. Pt would like a change in medication as she states her BP is high and HR low. Pt states she has not been feeling well since starting the carvedilol  but has not had increased BP until right before Christmas. Pt confirmed she has been taking olmesartan for the past 3 days. Pt was set up with a appointment with Orren Fabry on 03/10/24. Pt has requested I also send message to [facility] Advised I would send to Dr Wonda and Melissa for next steps until Appointment.

## 2024-01-28 NOTE — Telephone Encounter (Signed)
 Thx Melissa. Agree

## 2024-02-14 ENCOUNTER — Other Ambulatory Visit: Payer: Self-pay | Admitting: Family Medicine

## 2024-02-14 ENCOUNTER — Telehealth: Payer: Self-pay

## 2024-02-14 DIAGNOSIS — I10 Essential (primary) hypertension: Secondary | ICD-10-CM

## 2024-02-14 MED ORDER — OLMESARTAN MEDOXOMIL 20 MG PO TABS: 20.0000 mg | ORAL_TABLET | Freq: Every day | ORAL | 1 refills | Status: AC

## 2024-02-14 NOTE — Telephone Encounter (Signed)
 Copied from CRM #[id number]. Topic: Clinical - Medication Refill >> Feb 14, 2024 10:17 AM Vena HERO wrote: Medication: olmesartan  (BENICAR ) 20 MG tablet  Has the patient contacted their pharmacy? No (Agent: If no, request that the patient contact the pharmacy for the refill. If patient does not wish to contact the pharmacy document the reason why and proceed with request.) (Agent: If yes, when and what did the pharmacy advise?)  This is the patient's preferred pharmacy:  [id number] - [place] - [address] AT [facility] [address] [place] [address] Phone: [phone] Fax: [phone]  Is this the correct pharmacy for this prescription? Yes If no, delete pharmacy and type the correct one.   Has the prescription been filled recently? No  Is the patient out of the medication? No, will run out sunday  Has the patient been seen for an appointment in the last year OR does the patient have an upcoming appointment? Yes  Can we respond through MyChart? No  Agent: Please be advised that Rx refills may take up to 3 business days. We ask that you follow-up with your pharmacy.

## 2024-02-18 ENCOUNTER — Ambulatory Visit: Admitting: Family Medicine

## 2024-02-19 ENCOUNTER — Ambulatory Visit: Admitting: Family Medicine

## 2024-02-19 ENCOUNTER — Telehealth: Payer: Self-pay

## 2024-02-19 VITALS — BP 200/90 | HR 72 | Temp 98.2°F | Ht 67.0 in | Wt 160.0 lb

## 2024-02-19 DIAGNOSIS — E782 Mixed hyperlipidemia: Secondary | ICD-10-CM | POA: Diagnosis not present

## 2024-02-19 DIAGNOSIS — N1832 Chronic kidney disease, stage 3b: Secondary | ICD-10-CM | POA: Diagnosis not present

## 2024-02-19 DIAGNOSIS — I10 Essential (primary) hypertension: Secondary | ICD-10-CM | POA: Diagnosis not present

## 2024-02-19 DIAGNOSIS — R053 Chronic cough: Secondary | ICD-10-CM

## 2024-02-19 NOTE — Patient Instructions (Signed)
 STOP CARVEDILOL  INCREASE OLMESARTAN  20 MG TWO TABLETS DAILY OR MAY TAKE ONE TABLET TWICE A DAY.

## 2024-02-19 NOTE — Progress Notes (Signed)
 "  Subjective:  Patient ID: [name] [name], female    DOB: [birth year]  Age: 79 y.o. MRN: [id number]  Chief Complaint  Patient presents with   Medical Management of Chronic Issues    States coreg  was decreased to 6.25 mg BID due to lower pulse. Takes it at [facility] and 10 p.m. and take olmesartan  at 8 a.m.--unsure how she should take them. Does not feel good when taking coreg .  Has not started on crestor .    HPI: Discussed the use of AI scribe software for clinical note transcription with the patient, who gave verbal consent to proceed.  History of Present Illness [name] [name] is an 79 year old female with hypertension who presents with concerns about her blood pressure medication regimen.  Hypertension and antihypertensive medication regimen - Blood pressure ranging from 130/75 to 159/83 since the week of January 19th - Currently taking carvedilol  and olmesartan  - Carvedilol  dosing: 12 PM and 10 PM; olmesartan  dosing: 8 AM and 10 PM - Difficulty cutting carvedilol  tablets - Concerned about timing and effectiveness of regimen - Significant drop in blood pressure after carvedilol  administration - Improvement in pulse and overall well-being since starting current regimen; previous pulse dropped to 51-52  Adverse effects and patient's concerns of antihypertensive therapy - Fatigue while taking carvedilol  - Concern about carvedilol  use in individuals with asthma  Cough - Persistent cough for 1.5 to 2 months - Occurs primarily in the morning upon waking - No exertional trigger - No associated pain  Medication interactions and concerns - Concern about potential interactions between olmesartan  and allergy medications (Claritin, Benadryl) - Concern about potential interaction with Crestor , but has not taken Crestor  yet - I reviewed and reassured her these are given together.   Thyroid function - History of thyroid test indicating 'slow' result; specific details unknown - Family history  of thyroid disease (mother and sister on thyroid medication)       12/26/2023   10:44 AM 09/12/2023   10:44 AM 07/20/2023    9:47 AM 08/08/2022   10:12 AM 06/06/2022    8:00 AM  Depression screen PHQ 2/9  Decreased Interest 0 0 0 0 0  Down, Depressed, Hopeless 0 0 0 0 0  PHQ - 2 Score 0 0 0 0 0        12/26/2023   10:44 AM  Fall Risk   Falls in the past year? 0  Number falls in past yr: 0  Injury with Fall? 0  Risk for fall due to : No Fall Risks  Follow up Falls evaluation completed    Patient Care Team: Sherre Clapper, MD as PCP - General (Family Medicine) [facility], [facility] (Gynecology) Mat Browning, MD as Consulting Physician (Obstetrics and Gynecology) Wonda Sharper, MD as Consulting Physician (Cardiology) Tobie Robbi LABOR, MD Lloyd Savannah, MD as Referring Physician (Dermatology)   Review of Systems  Constitutional:  Negative for chills, fatigue and fever.  HENT:  Negative for congestion, ear pain and sore throat.   Respiratory:  Positive for cough. Negative for shortness of breath.   Cardiovascular:  Negative for chest pain.  Gastrointestinal:  Negative for abdominal pain, constipation, diarrhea, nausea and vomiting.  Genitourinary:  Negative for dysuria and urgency.  Musculoskeletal:  Negative for arthralgias and myalgias.  Skin:  Negative for rash.  Neurological:  Negative for dizziness and headaches.  Psychiatric/Behavioral:  Negative for dysphoric mood. The patient is not nervous/anxious.     Medications Ordered Prior to Encounter[1] Past  Medical History:  Diagnosis Date   Allergic rhinitis due to pollen    CONSTIPATION    Essential hypertension 09/21/2008   Qualifier: Diagnosis of  By: Gwenn Grimes     GERD    MITRAL VALVE PROLAPSE    Nonrheumatic mitral (valve) prolapse    PREMATURE ATRIAL CONTRACTIONS    Solitary pulmonary nodule    Past Surgical History:  Procedure Laterality Date   BACK SURGERY     CATARACT EXTRACTION,  BILATERAL Bilateral 2024   LAPAROSCOPIC HYSTERECTOMY      Family History  Problem Relation Age of Onset   Hypertension Mother    Heart disease Mother    Osteoporosis Mother    Thyroid disease Mother    Parkinson's disease Father    Thyroid disease Sister    Heart disease Other    Social History   Socioeconomic History   Marital status: Married    Spouse name: JW   Number of children: 2   Years of education: Not on file   Highest education level: Not on file  Occupational History   Occupation: Retired programmer, systems  Tobacco Use   Smoking status: Never   Smokeless tobacco: Never  Substance and Sexual Activity   Alcohol use: No   Drug use: Never   Sexual activity: Not Currently  Other Topics Concern   Not on file  Social History Narrative   Not on file   Social Drivers of Health   Tobacco Use: Low Risk (02/24/2024)   Patient History    Smoking Tobacco Use: Never    Smokeless Tobacco Use: Never    Passive Exposure: Not on file  Financial Resource Strain: Low Risk (07/20/2023)   Overall Financial Resource Strain (CARDIA)    Difficulty of Paying Living Expenses: Not hard at all  Food Insecurity: No Food Insecurity (07/20/2023)   Epic    Worried About Radiation Protection Practitioner of Food in the Last Year: Never true    Ran Out of Food in the Last Year: Never true  Transportation Needs: No Transportation Needs (07/20/2023)   Epic    Lack of Transportation (Medical): No    Lack of Transportation (Non-Medical): No  Physical Activity: Insufficiently Active (07/20/2023)   Exercise Vital Sign    Days of Exercise per Week: 3 days    Minutes of Exercise per Session: 30 min  Stress: No Stress Concern Present (07/20/2023)   [employer] of Occupational Health - Occupational Stress Questionnaire    Feeling of Stress: Not at all  Social Connections: Moderately Integrated (07/20/2023)   Social Connection and Isolation Panel    Frequency of Communication with Friends and Family: Three times a week     Frequency of Social Gatherings with Friends and Family: Three times a week    Attends Religious Services: More than 4 times per year    Active Member of Clubs or Organizations: No    Attends Banker Meetings: Never    Marital Status: Married  Depression (PHQ2-9): Low Risk (12/26/2023)   Depression (PHQ2-9)    PHQ-2 Score: 0  Alcohol Screen: Low Risk (07/20/2023)   Alcohol Screen    Last Alcohol Screening Score (AUDIT): 0  Housing: Low Risk (07/20/2023)   Epic    Unable to Pay for Housing in the Last Year: No    Number of Times Moved in the Last Year: 0    Homeless in the Last Year: No  Utilities: Not At Risk (07/20/2023)   Epic    Threatened  with loss of utilities: No  Health Literacy: Adequate Health Literacy (07/20/2023)   B1300 Health Literacy    Frequency of need for help with medical instructions: Never    Objective:  BP (!) 200/90   Pulse 72   Temp 98.2 F (36.8 C)   Ht 5' 7 (1.702 m)   Wt 160 lb (72.6 kg)   SpO2 97%   BMI 25.06 kg/m      02/19/2024    2:14 PM 02/19/2024    2:01 PM 12/26/2023   11:38 AM  BP/Weight  Systolic BP 200 170 128  Diastolic BP 90 90 80  Wt. (Lbs)  160   BMI  25.06 kg/m2     Physical Exam Vitals reviewed.  Constitutional:      Appearance: Normal appearance. She is normal weight.  HENT:     Nose: Rhinorrhea present. No congestion.  Neck:     Vascular: No carotid bruit.  Cardiovascular:     Rate and Rhythm: Normal rate and regular rhythm.     Heart sounds: Normal heart sounds.  Pulmonary:     Effort: Pulmonary effort is normal. No respiratory distress.     Breath sounds: Normal breath sounds.  Abdominal:     General: Abdomen is flat. Bowel sounds are normal.     Palpations: Abdomen is soft.     Tenderness: There is no abdominal tenderness.  Neurological:     Mental Status: She is alert and oriented to person, place, and time.  Psychiatric:        Mood and Affect: Mood normal.        Behavior: Behavior normal.          Lab Results  Component Value Date   WBC 10.4 02/19/2024   HGB 13.3 02/19/2024   HCT 39.9 02/19/2024   PLT 244 02/19/2024   GLUCOSE 92 02/19/2024   CHOL 271 (H) 02/19/2024   TRIG 125 02/19/2024   HDL 64 02/19/2024   LDLCALC 185 (H) 02/19/2024   ALT 14 02/19/2024   AST 23 02/19/2024   NA 142 02/19/2024   K 4.1 02/19/2024   CL 104 02/19/2024   CREATININE 1.12 (H) 02/19/2024   BUN 22 02/19/2024   CO2 26 02/19/2024   TSH 1.660 02/19/2024   HGBA1C 5.5 02/02/2016    Results for orders placed or performed in visit on 02/19/24  CBC with Differential/Platelet   Collection Time: 02/19/24  2:57 PM  Result Value Ref Range   WBC 10.4 3.4 - 10.8 x10E3/uL   RBC 4.40 3.77 - 5.28 x10E6/uL   Hemoglobin 13.3 11.1 - 15.9 g/dL   Hematocrit 60.0 65.9 - 46.6 %   MCV 91 79 - 97 fL   MCH 30.2 26.6 - 33.0 pg   MCHC 33.3 31.5 - 35.7 g/dL   RDW 87.7 88.2 - 84.5 %   Platelets 244 150 - 450 x10E3/uL   Neutrophils 73 Not Estab. %   Lymphs 18 Not Estab. %   Monocytes 6 Not Estab. %   Eos 3 Not Estab. %   Basos 0 Not Estab. %   Neutrophils Absolute 7.4 (H) 1.4 - 7.0 x10E3/uL   Lymphocytes Absolute 1.9 0.7 - 3.1 x10E3/uL   Monocytes Absolute 0.7 0.1 - 0.9 x10E3/uL   EOS (ABSOLUTE) 0.3 0.0 - 0.4 x10E3/uL   Basophils Absolute 0.0 0.0 - 0.2 x10E3/uL   Immature Granulocytes 0 Not Estab. %   Immature Grans (Abs) 0.0 0.0 - 0.1 x10E3/uL  Comprehensive metabolic panel with  GFR   Collection Time: 02/19/24  2:57 PM  Result Value Ref Range   Glucose 92 70 - 99 mg/dL   BUN 22 8 - 27 mg/dL   Creatinine, Ser 8.87 (H) 0.57 - 1.00 mg/dL   eGFR 50 (L) >40 fO/fpw/8.26   BUN/Creatinine Ratio 20 12 - 28   Sodium 142 134 - 144 mmol/L   Potassium 4.1 3.5 - 5.2 mmol/L   Chloride 104 96 - 106 mmol/L   CO2 26 20 - 29 mmol/L   Calcium  9.9 8.7 - 10.3 mg/dL   Total Protein 6.7 6.0 - 8.5 g/dL   Albumin 4.2 3.8 - 4.8 g/dL   Globulin, Total 2.5 1.5 - 4.5 g/dL   Bilirubin Total 0.4 0.0 - 1.2 mg/dL    Alkaline Phosphatase 72 49 - 135 IU/L   AST 23 0 - 40 IU/L   ALT 14 0 - 32 IU/L  Lipid panel   Collection Time: 02/19/24  2:57 PM  Result Value Ref Range   Cholesterol, Total 271 (H) 100 - 199 mg/dL   Triglycerides 874 0 - 149 mg/dL   HDL 64 >60 mg/dL   VLDL Cholesterol Cal 22 5 - 40 mg/dL   LDL Chol Calc (NIH) 814 (H) 0 - 99 mg/dL   Chol/HDL Ratio 4.2 0.0 - 4.4 ratio  T4, free   Collection Time: 02/19/24  2:58 PM  Result Value Ref Range   Free T4 1.20 0.82 - 1.77 ng/dL  TSH   Collection Time: 02/19/24  2:58 PM  Result Value Ref Range   TSH 1.660 0.450 - 4.500 uIU/mL  .  Assessment & Plan:   Assessment & Plan Essential hypertension -mcomplicated by white coat hypertension.  - Her bp cuff is equivilant to ours.  - Carvedilol  caused fatigue and bradycardia. Olmesartan  continued. - Discontinued carvedilol . - Increased olmesartan  to 40 mg daily, either as two 20 mg doses at once or split into morning and night doses. - Monitor blood pressure at home and report levels.  Orders:   CBC with Differential/Platelet   Comprehensive metabolic panel with GFR  Mixed hyperlipidemia Carvedilol  caused fatigue and bradycardia. Olmesartan  continued. - Discontinued carvedilol . - Increased olmesartan  to 40 mg daily, either as two 20 mg doses at once or split into morning and night doses. - Monitor blood pressure at home and report any significant changes.  Orders:   Lipid panel   T4, free   TSH  Stage 3b chronic kidney disease (HCC) Keep upcoming appointment with nephrology    Chronic cough Likely secondary to allergies.  Restart claritin.  IF no improvement, recommend cxr       Body mass index is 25.06 kg/m.   No orders of the defined types were placed in this encounter.   Orders Placed This Encounter  Procedures   CBC with Differential/Platelet   Comprehensive metabolic panel with GFR   Lipid panel   T4, free   TSH     Follow-up: Return in about 3 months  (around 05/19/2024) for chronic fasting.  An After Visit Summary was printed and given to the patient.  Abigail Free, MD [facility] [phone]     [1]  Current Outpatient Medications on File Prior to Visit  Medication Sig Dispense Refill   carvedilol  (COREG ) 12.5 MG tablet Take 1 tablet (12.5 mg total) by mouth 2 (two) times daily with a meal. (Patient taking differently: Take 6.25 mg by mouth 2 (two) times daily with a meal.) 60 tablet 3  albuterol  (VENTOLIN  HFA) 108 (90 Base) MCG/ACT inhaler Inhale 2 puffs into the lungs every 6 (six) hours as needed for wheezing or shortness of breath. 8 g 0   montelukast  (SINGULAIR ) 10 MG tablet Take 1 tablet (10 mg total) by mouth at bedtime. 90 tablet 1   Multiple Vitamins-Minerals (MULTIVITAMIN WITH MINERALS) tablet Take 1 tablet by mouth daily.     olmesartan  (BENICAR ) 20 MG tablet Take 1 tablet (20 mg total) by mouth daily. 90 tablet 1   No current facility-administered medications on file prior to visit.   "

## 2024-02-19 NOTE — Telephone Encounter (Signed)
 Copied from CRM #[id number]. Topic: Clinical - Prescription Issue >> Feb 19, 2024 11:18 AM Darshell M wrote: Reason for CRM: Patient wants to discuss blood pressure medication with provider.

## 2024-02-19 NOTE — Telephone Encounter (Signed)
 Patient re-scheduled for appointment that was cancelled due to weather.

## 2024-02-20 ENCOUNTER — Ambulatory Visit: Payer: Self-pay | Admitting: Family Medicine

## 2024-02-20 LAB — COMPREHENSIVE METABOLIC PANEL WITH GFR
ALT: 14 [IU]/L (ref 0–32)
AST: 23 [IU]/L (ref 0–40)
Albumin: 4.2 g/dL (ref 3.8–4.8)
Alkaline Phosphatase: 72 [IU]/L (ref 49–135)
BUN/Creatinine Ratio: 20 (ref 12–28)
BUN: 22 mg/dL (ref 8–27)
Bilirubin Total: 0.4 mg/dL (ref 0.0–1.2)
CO2: 26 mmol/L (ref 20–29)
Calcium: 9.9 mg/dL (ref 8.7–10.3)
Chloride: 104 mmol/L (ref 96–106)
Creatinine, Ser: 1.12 mg/dL — ABNORMAL HIGH (ref 0.57–1.00)
Globulin, Total: 2.5 g/dL (ref 1.5–4.5)
Glucose: 92 mg/dL (ref 70–99)
Potassium: 4.1 mmol/L (ref 3.5–5.2)
Sodium: 142 mmol/L (ref 134–144)
Total Protein: 6.7 g/dL (ref 6.0–8.5)
eGFR: 50 mL/min/{1.73_m2} — ABNORMAL LOW

## 2024-02-20 LAB — CBC WITH DIFFERENTIAL/PLATELET
Basophils Absolute: 0 10*3/uL (ref 0.0–0.2)
Basos: 0 %
EOS (ABSOLUTE): 0.3 10*3/uL (ref 0.0–0.4)
Eos: 3 %
Hematocrit: 39.9 % (ref 34.0–46.6)
Hemoglobin: 13.3 g/dL (ref 11.1–15.9)
Immature Grans (Abs): 0 10*3/uL (ref 0.0–0.1)
Immature Granulocytes: 0 %
Lymphocytes Absolute: 1.9 10*3/uL (ref 0.7–3.1)
Lymphs: 18 %
MCH: 30.2 pg (ref 26.6–33.0)
MCHC: 33.3 g/dL (ref 31.5–35.7)
MCV: 91 fL (ref 79–97)
Monocytes Absolute: 0.7 10*3/uL (ref 0.1–0.9)
Monocytes: 6 %
Neutrophils Absolute: 7.4 10*3/uL — ABNORMAL HIGH (ref 1.4–7.0)
Neutrophils: 73 %
Platelets: 244 10*3/uL (ref 150–450)
RBC: 4.4 x10E6/uL (ref 3.77–5.28)
RDW: 12.2 % (ref 11.7–15.4)
WBC: 10.4 10*3/uL (ref 3.4–10.8)

## 2024-02-20 LAB — LIPID PANEL
Chol/HDL Ratio: 4.2 ratio (ref 0.0–4.4)
Cholesterol, Total: 271 mg/dL — ABNORMAL HIGH (ref 100–199)
HDL: 64 mg/dL
LDL Chol Calc (NIH): 185 mg/dL — ABNORMAL HIGH (ref 0–99)
Triglycerides: 125 mg/dL (ref 0–149)
VLDL Cholesterol Cal: 22 mg/dL (ref 5–40)

## 2024-02-20 LAB — T4, FREE: Free T4: 1.2 ng/dL (ref 0.82–1.77)

## 2024-02-20 LAB — TSH: TSH: 1.66 u[IU]/mL (ref 0.450–4.500)

## 2024-02-22 ENCOUNTER — Encounter: Payer: Self-pay | Admitting: Family Medicine

## 2024-02-24 ENCOUNTER — Encounter: Payer: Self-pay | Admitting: Family Medicine

## 2024-02-24 NOTE — Assessment & Plan Note (Signed)
 Keep upcoming appointment with nephrology

## 2024-02-24 NOTE — Assessment & Plan Note (Signed)
 Carvedilol  caused fatigue and bradycardia. Olmesartan  continued. - Discontinued carvedilol . - Increased olmesartan  to 40 mg daily, either as two 20 mg doses at once or split into morning and night doses. - Monitor blood pressure at home and report any significant changes.  Orders:   Lipid panel   T4, free   TSH

## 2024-02-28 ENCOUNTER — Telehealth: Payer: Self-pay | Admitting: Pharmacist

## 2024-02-28 NOTE — Telephone Encounter (Signed)
 Pt is calling in with BP Reading Took 40 mg of  olmesartan  (BENICAR ) 20 MG tablet    at 8:55 am 2/4 9:30 pm 174/98 74 2/5 7:45 am 171/101 74 2/5 10:30 am 133/72 83 2/5 12:00 pm 132/74 66 2/5 1:14 162/88 73

## 2024-02-28 NOTE — Telephone Encounter (Signed)
 Patient called reporting that her PCP took her off of carvedilol  due to low HR and increased olmesartan  to 40mg . She says her BP did well for several days but then yesterday her BP was high. Took her second olmestartan (taking 20mg  BID) early bc she felt so bad. Husband had procedure in [facility] yesterday. BO high again this AM. Asking if she should take olmesartan  40mg  daily instead of splitting. Advised that it can be taken either way, but would be fine to take 40mg  this AM and continue with daily. We talked about waiting a few days to see if BP would improve again. If it doesn't we can add 2.5mg  of felodipine.  2/1: 147/77 2/3: 7:59 136/79 9:30 AM 134/64 2/4: 175/89 HR  This AM 171/101 HR 74

## 2024-03-10 ENCOUNTER — Ambulatory Visit: Admitting: Physician Assistant

## 2024-04-16 ENCOUNTER — Ambulatory Visit: Admitting: Cardiovascular Disease

## 2024-04-23 ENCOUNTER — Ambulatory Visit: Admitting: Family Medicine

## 2024-05-21 ENCOUNTER — Ambulatory Visit: Admitting: Family Medicine
